# Patient Record
Sex: Female | Born: 1965 | Race: White | Hispanic: No | Marital: Married | Smoking: Former smoker
Health system: Southern US, Community
[De-identification: ages and names within clinical notes are randomized; demographics above are authoritative.]

## PROBLEM LIST (undated history)

## (undated) DIAGNOSIS — M1990 Unspecified osteoarthritis, unspecified site: Secondary | ICD-10-CM

## (undated) DIAGNOSIS — Z8711 Personal history of peptic ulcer disease: Secondary | ICD-10-CM

## (undated) DIAGNOSIS — E559 Vitamin D deficiency, unspecified: Secondary | ICD-10-CM

## (undated) DIAGNOSIS — M25472 Effusion, left ankle: Secondary | ICD-10-CM

## (undated) DIAGNOSIS — G9332 Myalgic encephalomyelitis/chronic fatigue syndrome: Secondary | ICD-10-CM

## (undated) DIAGNOSIS — M797 Fibromyalgia: Secondary | ICD-10-CM

## (undated) DIAGNOSIS — M25471 Effusion, right ankle: Secondary | ICD-10-CM

## (undated) DIAGNOSIS — R5382 Chronic fatigue, unspecified: Secondary | ICD-10-CM

## (undated) DIAGNOSIS — K5900 Constipation, unspecified: Secondary | ICD-10-CM

## (undated) DIAGNOSIS — F419 Anxiety disorder, unspecified: Secondary | ICD-10-CM

## (undated) DIAGNOSIS — M549 Dorsalgia, unspecified: Secondary | ICD-10-CM

## (undated) DIAGNOSIS — D649 Anemia, unspecified: Secondary | ICD-10-CM

## (undated) DIAGNOSIS — E739 Lactose intolerance, unspecified: Secondary | ICD-10-CM

## (undated) DIAGNOSIS — R5383 Other fatigue: Secondary | ICD-10-CM

## (undated) DIAGNOSIS — K900 Celiac disease: Secondary | ICD-10-CM

## (undated) DIAGNOSIS — F32A Depression, unspecified: Secondary | ICD-10-CM

## (undated) DIAGNOSIS — M2550 Pain in unspecified joint: Secondary | ICD-10-CM

## (undated) DIAGNOSIS — M25475 Effusion, left foot: Secondary | ICD-10-CM

## (undated) DIAGNOSIS — Q249 Congenital malformation of heart, unspecified: Secondary | ICD-10-CM

## (undated) DIAGNOSIS — J42 Unspecified chronic bronchitis: Secondary | ICD-10-CM

## (undated) HISTORY — DX: Anxiety disorder, unspecified: F41.9

## (undated) HISTORY — DX: Congenital malformation of heart, unspecified: Q24.9

## (undated) HISTORY — DX: Constipation, unspecified: K59.00

## (undated) HISTORY — DX: Celiac disease: K90.0

## (undated) HISTORY — DX: Myalgic encephalomyelitis/chronic fatigue syndrome: G93.32

## (undated) HISTORY — PX: HERNIA REPAIR: SHX51

## (undated) HISTORY — DX: Effusion, left ankle: M25.471

## (undated) HISTORY — DX: Personal history of peptic ulcer disease: Z87.11

## (undated) HISTORY — DX: Dorsalgia, unspecified: M54.9

## (undated) HISTORY — DX: Other fatigue: R53.83

## (undated) HISTORY — DX: Anemia, unspecified: D64.9

## (undated) HISTORY — DX: Pain in unspecified joint: M25.50

## (undated) HISTORY — DX: Vitamin D deficiency, unspecified: E55.9

## (undated) HISTORY — DX: Unspecified osteoarthritis, unspecified site: M19.90

## (undated) HISTORY — DX: Chronic fatigue, unspecified: R53.82

## (undated) HISTORY — DX: Lactose intolerance, unspecified: E73.9

## (undated) HISTORY — DX: Effusion, left ankle: M25.472

## (undated) HISTORY — DX: Depression, unspecified: F32.A

## (undated) HISTORY — DX: Unspecified chronic bronchitis: J42

## (undated) HISTORY — DX: Fibromyalgia: M79.7

## (undated) HISTORY — DX: Effusion, left foot: M25.475

---
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
## 1994-09-12 HISTORY — PX: PARTIAL HYSTERECTOMY: SHX80

## 1997-09-12 HISTORY — PX: TOTAL VAGINAL HYSTERECTOMY: SHX2548

## 2004-03-10 ENCOUNTER — Ambulatory Visit (HOSPITAL_COMMUNITY): Admission: RE | Admit: 2004-03-10 | Discharge: 2004-03-10 | Payer: Self-pay | Admitting: General Surgery

## 2004-03-17 ENCOUNTER — Ambulatory Visit (HOSPITAL_COMMUNITY): Admission: RE | Admit: 2004-03-17 | Discharge: 2004-03-17 | Payer: Self-pay | Admitting: General Surgery

## 2010-10-03 ENCOUNTER — Encounter: Payer: Self-pay | Admitting: General Surgery

## 2013-03-18 ENCOUNTER — Ambulatory Visit: Payer: Self-pay | Admitting: Internal Medicine

## 2013-03-26 ENCOUNTER — Ambulatory Visit: Payer: Self-pay | Admitting: Internal Medicine

## 2013-04-18 ENCOUNTER — Encounter: Payer: Self-pay | Admitting: Internal Medicine

## 2013-04-18 ENCOUNTER — Ambulatory Visit (INDEPENDENT_AMBULATORY_CARE_PROVIDER_SITE_OTHER): Payer: BC Managed Care – PPO | Admitting: Internal Medicine

## 2013-04-18 VITALS — BP 130/81 | HR 90 | Resp 16 | Ht 68.0 in | Wt 223.0 lb

## 2013-04-18 DIAGNOSIS — K900 Celiac disease: Secondary | ICD-10-CM | POA: Insufficient documentation

## 2013-04-18 DIAGNOSIS — E663 Overweight: Secondary | ICD-10-CM

## 2013-04-18 DIAGNOSIS — Z90710 Acquired absence of both cervix and uterus: Secondary | ICD-10-CM

## 2013-04-18 DIAGNOSIS — M1990 Unspecified osteoarthritis, unspecified site: Secondary | ICD-10-CM

## 2013-04-18 LAB — CBC WITH DIFFERENTIAL/PLATELET
Basophils Relative: 0 % (ref 0–1)
Hemoglobin: 14.4 g/dL (ref 12.0–15.0)
Lymphocytes Relative: 30 % (ref 12–46)
MCH: 30.5 pg (ref 26.0–34.0)
Monocytes Absolute: 0.5 10*3/uL (ref 0.1–1.0)
Monocytes Relative: 8 % (ref 3–12)
Neutrophils Relative %: 59 % (ref 43–77)
Platelets: 236 10*3/uL (ref 150–400)
RBC: 4.72 MIL/uL (ref 3.87–5.11)
RDW: 13.9 % (ref 11.5–15.5)
WBC: 5.8 10*3/uL (ref 4.0–10.5)

## 2013-04-18 NOTE — Progress Notes (Signed)
Subjective:    Patient ID: Sheila Lam, female    DOB: [birth year], 47 y.o.   MRN: [id number]  HPI  Sheila Lam is here as a new pt for first visit to establish care.   She has a PMH of Celiac sprue, anxiety,  DJD with chronic pain involving neck, shoulder and back  (managed by Dr. Ermalene Postin).  She is S/P hysterectomy and unilateral oopherectomy.   She is concened that she cannnot lose weight.  She is under excess stress as her husband lost his job and she is working two jobs and will be starting a week-end job soon.  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     She is fatigued and has trouble losing weight.  No dry skin no alopecia.    Allergies  Allergen Reactions  . Codeine Nausea And Vomiting  . Gluten Meal     Celiac disease  . Soy Allergy     Breast swelling   Past Medical History  Diagnosis Date  . Anxiety   . Arthritis   . Celiac disease   . 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 Partial hysterectomy  1996  . Total vaginal hysterectomy  1999  . Cesarean section  1998  . Other surgical history  1986    Hernia surgery   History   Social History  . Marital Status: Single    Spouse Name: N/A    Number of Children: N/A  . Years of Education: N/A   Occupational History  . Not on file.   Social History Main Topics  . Smoking status: Former Smoker -- 9 years    Quit date: 09/12/1996  . Smokeless tobacco: Not on file  . Alcohol Use: 1.5 oz/week    3 drink(s) per week  . Drug Use: No  . Sexually Active: Yes -- Female partner(s)   Other Topics Concern  . Not on file   Social History Narrative  . No narrative on file   Family History  Problem Relation Age of Onset  . Hypertension Father   . Hyperlipidemia Father   . Hypothyroidism Mother   . Leukemia Paternal Grandmother   . Diabetes Paternal Grandmother   . Heart disease Paternal Grandmother   . Hypothyroidism  Maternal Grandmother   . Hyperthyroidism Maternal Grandmother   . Hyperthyroidism Maternal Grandmother   . Heart disease Maternal Grandmother    Patient Active Problem List   Diagnosis Date Noted  . Celiac sprue 04/18/2013  . DJD (degenerative joint disease) 04/18/2013  . S/P hysterectomy 04/18/2013   No current outpatient prescriptions on file prior to visit.   No current facility-administered medications on file prior to visit.      Review of Systems    see HPI Objective:   Physical Exam Physical Exam  Nursing note and vitals reviewed.  Constitutional: She is oriented to person, place, and time. She appears well-developed and well-nourished.  HENT:  Head: Normocephalic and atraumatic.  Cardiovascular: Normal rate and regular rhythm. Exam reveals no gallop and no friction rub.  No murmur heard.  Pulmonary/Chest: Breath sounds normal. She has no wheezes. She has no rales.  Neurological: She is alert and oriented to person, place, and time.  Skin: Skin is warm and dry.  Psychiatric: She has a normal mood and affect. Her behavior is normal.  Assessment & Plan:  Celiac sprue  Will check thyroid levels with free T3 and T4.    DJD with chronic pain  Managed by neurology Dr. Ermalene Postin  Obesity    S/P hysterectomy and unilateral oopherectomy

## 2013-04-19 LAB — COMPREHENSIVE METABOLIC PANEL
AST: 18 U/L (ref 0–37)
Alkaline Phosphatase: 53 U/L (ref 39–117)
BUN: 16 mg/dL (ref 6–23)
CO2: 29 mEq/L (ref 19–32)
Chloride: 102 mEq/L (ref 96–112)
Creat: 0.77 mg/dL (ref 0.50–1.10)
Sodium: 138 mEq/L (ref 135–145)
Total Bilirubin: 0.5 mg/dL (ref 0.3–1.2)

## 2013-04-19 LAB — T4, FREE: Free T4: 1.3 ng/dL (ref 0.80–1.80)

## 2013-04-19 LAB — LIPID PANEL
HDL: 55 mg/dL (ref >39)
Total CHOL/HDL Ratio: 3.3 Ratio
Triglycerides: 99 mg/dL (ref <150)
VLDL: 20 mg/dL (ref 0–40)

## 2013-04-19 LAB — T3, FREE: T3, Free: 3.2 pg/mL (ref 2.3–4.2)

## 2013-04-19 LAB — TSH: TSH: 2.027 u[IU]/mL (ref 0.350–4.500)

## 2013-04-20 DIAGNOSIS — E663 Overweight: Secondary | ICD-10-CM | POA: Insufficient documentation

## 2013-04-22 ENCOUNTER — Telehealth: Payer: Self-pay | Admitting: *Deleted

## 2013-04-22 ENCOUNTER — Encounter: Payer: Self-pay | Admitting: *Deleted

## 2013-04-22 LAB — IODINE, SERUM/PLASMA: Iodine: 62 mcg/L (ref 52–109)

## 2013-04-22 NOTE — Telephone Encounter (Signed)
Message copied by Conley Rolls on Mon Apr 22, 2013  3:51 PM ------      Message from: Emi Belfast D      Created: Mon Apr 22, 2013  7:40 AM       Jolayne Haines            Call pt and let her know that her thyroid free levels are normal.  Her FSH indicates that her one ovary is still producing enough female hormone.  Her iodine level is pending.  She can schedule   Follow up if she wishes.              Ok to mail labs to her ------

## 2013-04-22 NOTE — Telephone Encounter (Signed)
Pt notified regarding test results ,ailed to home address Pt wishes to have something for sleep

## 2013-04-23 ENCOUNTER — Telehealth: Payer: Self-pay | Admitting: *Deleted

## 2013-04-23 MED ORDER — LORAZEPAM 0.5 MG PO TABS: ORAL_TABLET | ORAL | Status: DC

## 2013-04-23 NOTE — Telephone Encounter (Signed)
Left detailed message on pt's home VM per pts request regarding the ativan that was called in to [facility]

## 2013-04-23 NOTE — Telephone Encounter (Signed)
Advise pt I will give her a low dose anxiety med that will help her sleep.  She is only to use this twice a week and she needs to see me in office for follow up in 1-2 months.  She can start with 1/2 pill at hs  If this does not help she can go to 1 whole pill 2 night later.

## 2013-04-24 ENCOUNTER — Telehealth: Payer: Self-pay | Admitting: *Deleted

## 2013-04-24 NOTE — Telephone Encounter (Signed)
Too many side effects from Ambien   Especially with amnesia.  . I have never seen any celiac that could not tolerate Ativan.  She can discuss with her research MD if Ativan OK

## 2013-04-24 NOTE — Telephone Encounter (Signed)
See Heather's note

## 2013-04-24 NOTE — Telephone Encounter (Signed)
Luke called and said that she can not take Lorazepam because it is not gluten free.  She asked if we could call in Ambien instead as it is gluten free.

## 2013-04-25 ENCOUNTER — Encounter: Payer: Self-pay | Admitting: *Deleted

## 2013-04-25 ENCOUNTER — Telehealth: Payer: Self-pay | Admitting: *Deleted

## 2013-04-25 MED ORDER — ZOLPIDEM TARTRATE 5 MG PO TABS
5.0000 mg | ORAL_TABLET | Freq: Every evening | ORAL | Status: DC | PRN
Start: 2013-04-25 — End: 2015-06-11

## 2013-04-25 NOTE — Telephone Encounter (Signed)
LVM message for pt to return call

## 2013-04-25 NOTE — Telephone Encounter (Signed)
Pt called tearful stating that she is not able to tolerate ativan she has used it before and it makes her groggy the next day. Pt tearful and anxious on phone saying she hasnt slept in days and all her friends at the celiac support club  that have taken  [place] have been pleased. Pt states please can she prescribe ambien or try another sleep aid

## 2013-04-25 NOTE — Telephone Encounter (Signed)
Sheila Lam  Tell her that I will give her #6 Ambien that she is to use only twice a week.    She must see me in office for follow up before she will get any refills.  Give her a 30 min appt for follow up

## 2013-04-26 ENCOUNTER — Telehealth: Payer: Self-pay | Admitting: *Deleted

## 2013-04-26 NOTE — Telephone Encounter (Signed)
Ambien called in to [facility]

## 2013-05-14 ENCOUNTER — Other Ambulatory Visit: Payer: Self-pay | Admitting: *Deleted

## 2013-05-14 NOTE — Telephone Encounter (Signed)
See prior note  I want to see in office before I refill any more sleeping med

## 2013-05-14 NOTE — Telephone Encounter (Signed)
Left VM message requesting pt return call for a follow up appt regarding ambien

## 2013-05-14 NOTE — Telephone Encounter (Addendum)
Refill request pharmacy sent a request for 30 tablets copay is same for 6 or 30 see note from pharmacy on desk

## 2013-05-14 NOTE — Telephone Encounter (Signed)
Sheila Lam  I want to see [name] in office before I refill any more [name]

## 2013-05-16 ENCOUNTER — Telehealth: Payer: Self-pay | Admitting: *Deleted

## 2013-05-17 NOTE — Telephone Encounter (Signed)
error 

## 2015-06-11 ENCOUNTER — Encounter: Payer: Self-pay | Admitting: *Deleted

## 2015-06-11 ENCOUNTER — Emergency Department
Admission: EM | Admit: 2015-06-11 | Discharge: 2015-06-11 | Disposition: A | Discharge status: Home / Self Care | Payer: BLUE CROSS/BLUE SHIELD | Source: Home / Self Care | Attending: Family Medicine | Admitting: Family Medicine

## 2015-06-11 DIAGNOSIS — R059 Cough, unspecified: Secondary | ICD-10-CM

## 2015-06-11 DIAGNOSIS — J0101 Acute recurrent maxillary sinusitis: Secondary | ICD-10-CM

## 2015-06-11 DIAGNOSIS — R05 Cough: Secondary | ICD-10-CM

## 2015-06-11 MED ORDER — BENZONATATE 100 MG PO CAPS: 100.0000 mg | ORAL_CAPSULE | Freq: Three times a day (TID) | ORAL | Status: DC

## 2015-06-11 MED ORDER — AMOXICILLIN-POT CLAVULANATE 875-125 MG PO TABS: 1.0000 | ORAL_TABLET | Freq: Two times a day (BID) | ORAL | Status: DC

## 2015-06-11 NOTE — ED Notes (Signed)
Pt c/o 4+weeks sinus congestion and drainage. More recently became worse with thicker drainage and cough. Taken Mucinex, Flonase and Advil.

## 2015-06-11 NOTE — ED Provider Notes (Signed)
CSN: [id number]     Arrival date & time 06/11/15  1220 History   First MD Initiated Contact with Patient 06/11/15 1223     Chief Complaint  Patient presents with  . Sinus Problem  . Cough   (Consider location/radiation/quality/duration/timing/severity/associated sxs/prior Treatment) HPI  Pt is a 49yo feamle presenting to [facility] with c/o over 4 weeks of nasal congestion and post-nasal drip but states over the last 1 week, her nasal drainage has become thicker.  Symptoms are moderate in severity.  Reports mild intermittent productive cough.  She has also develop frontal headaches and feels like there is pressure and swelling around the bridge of her nose.  Pt c/o bilateral ear "fullness." She has been taking Mucinex with minimal relief and states she uses Flonase daily.  Pt also notes she is looking for a new PCP as her prior one just left the office she was going to.  Denies sick contacts or recent travel.   Past Medical History  Diagnosis Date  . Anxiety   . Arthritis   . Celiac disease   . 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 Partial hysterectomy  1996  . Total vaginal hysterectomy  1999  . Cesarean section  1998  . Other surgical history  1986    Hernia surgery   Family History  Problem Relation Age of Onset  . Hypertension Father   . Hyperlipidemia Father   . Hypothyroidism Mother   . Leukemia Paternal Grandmother   . Diabetes Paternal Grandmother   . Heart disease Paternal Grandmother   . Hypothyroidism Maternal Grandmother   . Hyperthyroidism Maternal Grandmother   . Hyperthyroidism Maternal Grandmother   . Heart disease Maternal Grandmother    Social History  Substance Use Topics  . Smoking status: Former Smoker -- 9 years    Quit date: 09/12/1996  . Smokeless tobacco: None  . Alcohol Use: 1.5 oz/week    3 drink(s) per week   OB History    No data available     Review of  Systems  Constitutional: Negative for fever and chills.  HENT: Positive for congestion, ear pain ( fullness), facial swelling ( around bridge of nose), postnasal drip, rhinorrhea and sinus pressure. Negative for sore throat, trouble swallowing and voice change.   Respiratory: Positive for cough. Negative for shortness of breath.   Cardiovascular: Negative for chest pain and palpitations.  Gastrointestinal: Negative for nausea, vomiting, abdominal pain and diarrhea.  Musculoskeletal: Negative for myalgias, back pain and arthralgias.  Skin: Negative for rash.  All other systems reviewed and are negative.   Allergies  Codeine; Gluten meal; and Soy allergy  Home Medications   Prior to Admission medications   Medication Sig Start Date End Date Taking? Authorizing Provider  amoxicillin-clavulanate (AUGMENTIN) 875-125 MG tablet Take 1 tablet by mouth 2 (two) times daily. One po bid x 7 days 06/11/15   Noland Fordyce, PA-C  benzonatate (TESSALON) 100 MG capsule Take 1 capsule (100 mg total) by mouth every 8 (eight) hours. 06/11/15   Noland Fordyce, PA-C   Meds Ordered and Administered this Visit  Medications - No data to display  BP 145/86 mmHg  Pulse 81  Temp(Src) 97.9 F (36.6 C) (Oral)  Resp 16  Ht 5' 8"  (1.727 m)  Wt 246 lb (111.585 kg)  BMI 37.41 kg/m2  SpO2 99% No data found.   Physical Exam  Constitutional: She appears well-developed and well-nourished. No distress.  HENT:  Head: Normocephalic and atraumatic.  Right Ear: Hearing, tympanic membrane, external ear and ear canal normal.  Left Ear: Hearing, tympanic membrane, external ear and ear canal normal.  Nose: Mucosal edema present. Right sinus exhibits maxillary sinus tenderness. Right sinus exhibits no frontal sinus tenderness. Left sinus exhibits maxillary sinus tenderness. Left sinus exhibits no frontal sinus tenderness.  Mouth/Throat: Uvula is midline, oropharynx is clear and moist and mucous membranes are normal.   Tenderness around bridge of nose and over maxillary sinuses. No obvious edema. No erythema or warmth.  Eyes: Conjunctivae are normal. No scleral icterus.  Neck: Normal range of motion. Neck supple.  Cardiovascular: Normal rate, regular rhythm and normal heart sounds.   Pulmonary/Chest: Effort normal and breath sounds normal. No respiratory distress. She has no wheezes. She has no rales. She exhibits no tenderness.  Musculoskeletal: Normal range of motion.  Neurological: She is alert.  Skin: Skin is warm and dry. She is not diaphoretic.  Nursing note and vitals reviewed.   ED Course  Procedures (including critical care time)  Labs Review Labs Reviewed - No data to display  Imaging Review No results found.   MDM   1. Acute recurrent maxillary sinusitis   2. Cough    Pt c/o worsening nasal congestion with facial pain and pressure that initially started over 1 month ago but worse over last 1 week. Pt reports concern for swelling around bridge of nose. No obvious edema, erythema or warmth. Not concerned for cellulitis, however, pt is tender over maxillary sinuses. Will tx for bacterial cause of maxillary sinusitis. Rx: Augmentin and Tessalon. Pt may continue using Mucinex and Flonase. Resources for Primary Care provided. F/u in 1 week if not improving, sooner if worsening. Patient verbalized understanding and agreement with treatment plan.   Noland Fordyce, PA-C 06/11/15 1259

## 2015-06-11 NOTE — Discharge Instructions (Signed)
Please take antibiotics as prescribed and be sure to complete entire course even if you start to feel better to ensure infection does not come back.   Cool Mist Vaporizers Vaporizers may help relieve the symptoms of a cough and cold. They add moisture to the air, which helps mucus to become thinner and less sticky. This makes it easier to breathe and cough up secretions. Cool mist vaporizers do not cause serious burns like hot mist vaporizers, which may also be called steamers or humidifiers. Vaporizers have not been proven to help with colds. You should not use a vaporizer if you are allergic to mold. HOME CARE INSTRUCTIONS  Follow the package instructions for the vaporizer.  Do not use anything other than distilled water in the vaporizer.  Do not run the vaporizer all of the time. This can cause mold or bacteria to grow in the vaporizer.  Clean the vaporizer after each time it is used.  Clean and dry the vaporizer well before storing it.  Stop using the vaporizer if worsening respiratory symptoms develop. Document Released: 05/26/2004 Document Revised: 09/03/2013 Document Reviewed: 01/16/2013 [facility] Patient Information 2015 [place], [place]. This information is not intended to replace advice given to you by your health care provider. Make sure you discuss any questions you have with your health care provider.

## 2017-08-09 ENCOUNTER — Ambulatory Visit: Payer: BLUE CROSS/BLUE SHIELD | Admitting: Gastroenterology

## 2019-03-26 ENCOUNTER — Encounter: Payer: Self-pay | Admitting: Family Medicine

## 2019-03-26 ENCOUNTER — Ambulatory Visit: Payer: BC Managed Care – PPO | Admitting: Family Medicine

## 2019-03-26 ENCOUNTER — Other Ambulatory Visit: Payer: Self-pay

## 2019-03-26 VITALS — BP 132/78 | HR 67 | Temp 98.2°F | Ht 69.5 in | Wt 213.0 lb

## 2019-03-26 DIAGNOSIS — N10 Acute tubulo-interstitial nephritis: Secondary | ICD-10-CM

## 2019-03-26 DIAGNOSIS — R300 Dysuria: Secondary | ICD-10-CM

## 2019-03-26 LAB — POCT URINALYSIS DIPSTICK
Appearance: NEGATIVE
Bilirubin, UA: NEGATIVE
Blood, UA: NEGATIVE
Glucose, UA: NEGATIVE
Ketones, UA: NEGATIVE
Leukocytes, UA: NEGATIVE
Nitrite, UA: NEGATIVE
Odor: NEGATIVE
Protein, UA: NEGATIVE
Spec Grav, UA: 1.015 (ref 1.010–1.025)
Urobilinogen, UA: 0.2 E.U./dL
pH, UA: 6.5 (ref 5.0–8.0)

## 2019-03-26 MED ORDER — SULFAMETHOXAZOLE-TRIMETHOPRIM 800-160 MG PO TABS: 1.0000 | ORAL_TABLET | Freq: Two times a day (BID) | ORAL | 0 refills | Status: DC

## 2019-03-26 MED ORDER — NEURONTIN 100 MG PO CAPS: 100.0000 mg | ORAL_CAPSULE | Freq: Three times a day (TID) | ORAL | 3 refills | Status: DC

## 2019-03-26 MED ORDER — GABAPENTIN 100 MG PO CAPS
100.0000 mg | ORAL_CAPSULE | Freq: Three times a day (TID) | ORAL | 3 refills | Status: DC
Start: 2019-03-26 — End: 2019-03-26

## 2019-03-26 MED ORDER — CEFTRIAXONE SODIUM 1 G IJ SOLR
1.0000 g | Freq: Once | INTRAMUSCULAR | Status: AC
  Administered 2019-03-26: 1 g via INTRAMUSCULAR

## 2019-03-26 MED ORDER — BACTRIM DS 800-160 MG PO TABS: 1.0000 | ORAL_TABLET | Freq: Two times a day (BID) | ORAL | 0 refills | Status: DC

## 2019-03-26 NOTE — Progress Notes (Signed)
Chief Complaint  Patient presents with  . New Patient (Initial Visit)  . Dysuria  . Back Pain  . Urinary Urgency    Sheila Lam is a 53 y.o. female here for possible UTI.  Duration: 8 days. Symptoms: urinary frequency/urgency, dysuria, hesitancy, retention, flank pain b/l, nightsweats Denies: hematuria, fever, nausea and vomiting, vaginal discharge Hx of recurrent UTI? No   ROS:  Constitutional: denies fever GU: As noted in HPI  Past Medical History:  Diagnosis Date  . Anxiety   . Arthritis   . Celiac disease   . Heart abnormality    At birth but corrected itself about 1 year later    BP 132/78 (BP Location: Left Arm, Patient Position: Sitting, Cuff Size: Large)   Pulse 67   Temp 98.2 F (36.8 C) (Oral)   Ht 5' 9.5" (1.765 m)   Wt 213 lb (96.6 kg)   SpO2 98%   BMI 31.00 kg/m  General: Awake, alert, appears stated age Heart: RRR Lungs: CTAB, normal respiratory effort, no accessory muscle usage Abd: BS+, soft, NT, ND, no masses or organomegaly MSK: +CVA tenderness, worse on R Psych: Age appropriate judgment and insight  Acute pyelonephritis - Plan: POCT Urinalysis Dipstick, Urine Culture, BACTRIM DS 800-160 MG tablet, cefTRIAXone (ROCEPHIN) injection 1 g, UA neg, from a clinical standpoint, I am very worried for kidney infection with sweats and CVA ttp. Rocephin today (should not affect CD due to no GI involvement) and Bactrim, which is on list that is gluten free. Warning signs and symptoms verbalized and written down in AVS. Await results of culture.   Dysuria - Plan: POCT Urinalysis Dipstick, Urine Culture, cefTRIAXone (ROCEPHIN) injection 1 g  Orders as above. Stay hydrated. F/u in 1 mo for CPE. The patient voiced understanding and agreement to the plan.  [place], DO 03/26/19 3:53 PM

## 2019-03-26 NOTE — Patient Instructions (Addendum)
Stay hydrated.   Warning signs/symptoms: Uncontrollable nausea/vomiting, fevers, worsening symptoms despite treatment, confusion.  Give us around 2 business days to get culture back to you.  Let us know if you need anything. 

## 2019-03-27 ENCOUNTER — Encounter: Payer: Self-pay | Admitting: Family Medicine

## 2019-03-27 ENCOUNTER — Other Ambulatory Visit: Payer: Self-pay | Admitting: Family Medicine

## 2019-03-27 MED ORDER — CIPRO 500 MG PO TABS: 500.0000 mg | ORAL_TABLET | Freq: Two times a day (BID) | ORAL | 0 refills | Status: DC

## 2019-03-27 NOTE — Progress Notes (Signed)
Cipro sent in.

## 2019-03-28 LAB — URINE CULTURE
MICRO NUMBER:: 665039
Result:: NO GROWTH
SPECIMEN QUALITY:: ADEQUATE

## 2019-03-30 ENCOUNTER — Encounter: Payer: Self-pay | Admitting: Family Medicine

## 2019-04-01 ENCOUNTER — Other Ambulatory Visit: Payer: Self-pay | Admitting: Family Medicine

## 2019-04-01 MED ORDER — CEFDINIR 300 MG PO CAPS: 300.0000 mg | ORAL_CAPSULE | Freq: Two times a day (BID) | ORAL | 0 refills | Status: AC

## 2019-04-24 ENCOUNTER — Encounter: Payer: BC Managed Care – PPO | Admitting: Family Medicine

## 2019-05-17 ENCOUNTER — Encounter: Payer: BC Managed Care – PPO | Admitting: Family Medicine

## 2019-06-13 ENCOUNTER — Other Ambulatory Visit: Payer: Self-pay

## 2019-06-14 ENCOUNTER — Encounter: Payer: Self-pay | Admitting: Family Medicine

## 2019-06-14 ENCOUNTER — Ambulatory Visit (INDEPENDENT_AMBULATORY_CARE_PROVIDER_SITE_OTHER): Payer: BC Managed Care – PPO | Admitting: Family Medicine

## 2019-06-14 VITALS — BP 122/82 | HR 77 | Temp 97.5°F | Ht 70.0 in | Wt 213.2 lb

## 2019-06-14 DIAGNOSIS — Z0000 Encounter for general adult medical examination without abnormal findings: Secondary | ICD-10-CM

## 2019-06-14 DIAGNOSIS — Z114 Encounter for screening for human immunodeficiency virus [HIV]: Secondary | ICD-10-CM | POA: Diagnosis not present

## 2019-06-14 DIAGNOSIS — Z23 Encounter for immunization: Secondary | ICD-10-CM

## 2019-06-14 DIAGNOSIS — Z1231 Encounter for screening mammogram for malignant neoplasm of breast: Secondary | ICD-10-CM | POA: Diagnosis not present

## 2019-06-14 NOTE — Patient Instructions (Addendum)
Give [place] 2-3 business days to get the results of your labs back.   Keep the diet clean and stay active.  Consider core workouts and yoga on YouTube.   Let us know if you need anything.

## 2019-06-14 NOTE — Addendum Note (Signed)
Addended by: Sharon Seller B on: 06/14/2019 04:27 PM   Modules accepted: Orders

## 2019-06-14 NOTE — Addendum Note (Signed)
Addended by: Caffie Pinto on: 06/14/2019 04:23 PM   Modules accepted: Orders

## 2019-06-14 NOTE — Addendum Note (Signed)
Addended by: Caffie Pinto on: 06/14/2019 04:24 PM   Modules accepted: Orders

## 2019-06-14 NOTE — Progress Notes (Signed)
Chief Complaint  Patient presents with  . Annual Exam     Well Woman Sheila Lam is here for a complete physical.   Her last physical was >1 year ago.  Current diet: in general, a "healthy" diet. Current exercise: walking. Weight is stable and she denies daytime fatigue. No LMP recorded. Patient has had a hysterectomy. Seatbelt? Yes  Health Maintenance Mammogram- No Tetanus- No HIV screening- No  Past Medical History:  Diagnosis Date  . Anxiety   . Arthritis   . Celiac disease   . 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 CESAREAN SECTION  1998  . OTHER SURGICAL HISTORY  1986   Hernia surgery  . PARTIAL HYSTERECTOMY  1996  . TOTAL VAGINAL HYSTERECTOMY  1999    Medications  Current Outpatient Medications on File Prior to Visit  Medication Sig Dispense Refill  . NEURONTIN 100 MG capsule Take 1 capsule (100 mg total) by mouth 3 (three) times daily. 90 capsule 3   Allergies Allergies  Allergen Reactions  . Codeine Nausea And Vomiting  . Gluten Meal     Celiac disease  . Soy Allergy     Breast swelling    Review of Systems: Constitutional:  no unexpected weight changes Eye:  no recent significant change in vision Ear/Nose/Mouth/Throat:  Ears:  no tinnitus or vertigo and hearing is steadily worsening Nose/Mouth/Throat:  no complaints of nasal congestion, no sore throat Cardiovascular: no chest pain Respiratory:  no cough and no shortness of breath Gastrointestinal:  no abdominal pain, no change in bowel habits GU:  Female: negative for dysuria or pelvic pain Musculoskeletal/Extremities: +flank pain b/l; otherwise no pain of the joints Integumentary (Skin/Breast):  no abnormal skin lesions reported Neurologic:  no headaches Endocrine: +thirst Hematologic/Lymphatic:  No areas of easy bleeding  Exam BP 122/82 (BP Location: Left Arm, Patient Position: Sitting, Cuff Size:  Normal)   Pulse 77   Temp (!) 97.5 F (36.4 C) (Temporal)   Ht 5' 10"  (1.778 m)   Wt 213 lb 4 oz (96.7 kg)   SpO2 98%   BMI 30.60 kg/m  General:  well developed, well nourished, in no apparent distress Skin:  no significant moles, warts, or growths Head:  no masses, lesions, or tenderness Eyes:  pupils equal and round, sclera anicteric without injection Ears:  canals without lesions, TMs shiny without retraction, no obvious effusion, no erythema Nose:  nares patent, septum midline, mucosa normal, and no drainage or sinus tenderness Throat/Pharynx:  lips and gingiva without lesion; tongue and uvula midline; non-inflamed pharynx; no exudates or postnasal drainage Neck: neck supple without adenopathy, thyromegaly, or masses Lungs:  clear to auscultation, breath sounds equal bilaterally, no respiratory distress Cardio:  regular rate and rhythm, no bruits, no LE edema Abdomen:  abdomen soft, nontender; bowel sounds normal; no masses or organomegaly Genital: Defer to GYN Musculoskeletal:  symmetrical muscle groups noted without atrophy or deformity Extremities:  no clubbing, cyanosis, or edema, no deformities, no skin discoloration Neuro:  gait normal; deep tendon reflexes normal and symmetric Psych: well oriented with normal range of affect and appropriate judgment/insight  Assessment and Plan  Well adult exam - Plan: CBC, IBC + Ferritin, Lipid panel, Comprehensive metabolic panel, LH, FSH, Vitamin D (25 hydroxy)  Screening for HIV (human immunodeficiency virus) - Plan: HIV Antibody (routine testing w rflx)  Encounter for screening mammogram for malignant  neoplasm of breast - Plan: MM DIGITAL SCREENING BILATERAL   Well 53 y.o. female. Counseled on diet and exercise. Declines flu shot Other orders as above. Follow up in 6 mo for med ck. The patient voiced understanding and agreement to the plan.  [place], DO 06/14/19 4:17 PM

## 2019-06-15 LAB — CBC
HCT: 43.5 % (ref 35.0–45.0)
Hemoglobin: 14.9 g/dL (ref 11.7–15.5)
MCH: 31.9 pg (ref 27.0–33.0)
MCHC: 34.3 g/dL (ref 32.0–36.0)
MCV: 93.1 fL (ref 80.0–100.0)
MPV: 12.9 fL — ABNORMAL HIGH (ref 7.5–12.5)
Platelets: 177 10*3/uL (ref 140–400)
RBC: 4.67 10*6/uL (ref 3.80–5.10)
RDW: 12.2 % (ref 11.0–15.0)
WBC: 3.8 10*3/uL (ref 3.8–10.8)

## 2019-06-15 LAB — COMPREHENSIVE METABOLIC PANEL
AG Ratio: 2.4 (calc) (ref 1.0–2.5)
ALT: 21 U/L (ref 6–29)
AST: 21 U/L (ref 10–35)
Albumin: 4.7 g/dL (ref 3.6–5.1)
Alkaline phosphatase (APISO): 59 U/L (ref 37–153)
BUN: 14 mg/dL (ref 7–25)
CO2: 31 mmol/L (ref 20–32)
Calcium: 10.2 mg/dL (ref 8.6–10.4)
Chloride: 107 mmol/L (ref 98–110)
Creat: 0.91 mg/dL (ref 0.50–1.05)
Globulin: 2 g/dL (calc) (ref 1.9–3.7)
Glucose, Bld: 85 mg/dL (ref 65–99)
Potassium: 4.7 mmol/L (ref 3.5–5.3)
Sodium: 142 mmol/L (ref 135–146)
Total Bilirubin: 0.6 mg/dL (ref 0.2–1.2)
Total Protein: 6.7 g/dL (ref 6.1–8.1)

## 2019-06-15 LAB — IRON,TIBC AND FERRITIN PANEL
%SAT: 50 % — ABNORMAL HIGH (ref 16–45)
Ferritin: 285 ng/mL — ABNORMAL HIGH (ref 16–232)
Iron: 132 ug/dL (ref 45–160)
TIBC: 265 ug/dL (ref 250–450)

## 2019-06-15 LAB — LIPID PANEL
Cholesterol: 190 mg/dL
HDL: 49 mg/dL — ABNORMAL LOW
LDL Cholesterol (Calc): 113 mg/dL — ABNORMAL HIGH
Non-HDL Cholesterol (Calc): 141 mg/dL — ABNORMAL HIGH
Total CHOL/HDL Ratio: 3.9 (calc)
Triglycerides: 163 mg/dL — ABNORMAL HIGH

## 2019-06-15 LAB — FOLLICLE STIMULATING HORMONE: FSH: 75.8 m[IU]/mL

## 2019-06-15 LAB — VITAMIN D 25 HYDROXY (VIT D DEFICIENCY, FRACTURES): Vit D, 25-Hydroxy: 23 ng/mL — ABNORMAL LOW (ref 30–100)

## 2019-06-15 LAB — HIV ANTIBODY (ROUTINE TESTING W REFLEX): HIV 1&2 Ab, 4th Generation: NONREACTIVE

## 2019-06-15 LAB — LUTEINIZING HORMONE: LH: 33.6 m[IU]/mL

## 2019-06-17 ENCOUNTER — Encounter: Payer: Self-pay | Admitting: Family Medicine

## 2019-06-18 DIAGNOSIS — Z0279 Encounter for issue of other medical certificate: Secondary | ICD-10-CM

## 2019-06-30 ENCOUNTER — Encounter: Payer: Self-pay | Admitting: Family Medicine

## 2019-07-08 ENCOUNTER — Telehealth: Payer: BC Managed Care – PPO | Admitting: Family

## 2019-07-08 DIAGNOSIS — B9689 Other specified bacterial agents as the cause of diseases classified elsewhere: Secondary | ICD-10-CM

## 2019-07-08 DIAGNOSIS — J019 Acute sinusitis, unspecified: Secondary | ICD-10-CM | POA: Diagnosis not present

## 2019-07-08 MED ORDER — AMOXICILLIN-POT CLAVULANATE 875-125 MG PO TABS: 1.0000 | ORAL_TABLET | Freq: Two times a day (BID) | ORAL | 0 refills | Status: DC

## 2019-07-08 NOTE — Progress Notes (Signed)
We are sorry that you are not feeling well.  Here is how we plan to help!  Based on what you have shared with me it looks like you have sinusitis.  Sinusitis is inflammation and infection in the sinus cavities of the head.  Based on your presentation I believe you most likely have Acute Bacterial Sinusitis.  This is an infection caused by bacteria and is treated with antibiotics. I have prescribed Augmentin 833m/125mg one tablet twice daily with food, for 7 days. You may use an oral decongestant such as Mucinex D or if you have glaucoma or high blood pressure use plain Mucinex. Saline nasal spray help and can safely be used as often as needed for congestion.  If you develop worsening sinus pain, fever or notice severe headache and vision changes, or if symptoms are not better after completion of antibiotic, please schedule an appointment with a health care provider.    Sinus infections are not as easily transmitted as other respiratory infection, however we still recommend that you avoid close contact with loved ones, especially the very young and elderly.  Remember to wash your hands thoroughly throughout the day as this is the number one way to prevent the spread of infection!  Home Care:  Only take medications as instructed by your medical team.  Complete the entire course of an antibiotic.  Do not take these medications with alcohol.  A steam or ultrasonic humidifier can help congestion.  You can place a towel over your head and breathe in the steam from hot water coming from a faucet.  Avoid close contacts especially the very young and the elderly.  Cover your mouth when you cough or sneeze.  Always remember to wash your hands.  Get Help Right Away If:  You develop worsening fever or sinus pain.  You develop a severe head ache or visual changes.  Your symptoms persist after you have completed your treatment plan.  Make sure you  Understand these instructions.  Will watch your  condition.  Will get help right away if you are not doing well or get worse.  Your e-visit answers were reviewed by a board certified advanced clinical practitioner to complete your personal care plan.  Depending on the condition, your plan could have included both over the counter or prescription medications.  If there is a problem please reply  once you have received a response from your provider.  Your safety is important to [place]  If you have drug allergies check your prescription carefully.    You can use MyChart to ask questions about today's visit, request a non-urgent call back, or ask for a work or school excuse for 24 hours related to this e-Visit. If it has been greater than 24 hours you will need to follow up with your provider, or enter a new e-Visit to address those concerns.  You will get an e-mail in the next two days asking about your experience.  I hope that your e-visit has been valuable and will speed your recovery. Thank you for using e-visits.  Greater than 5 minutes, yet less than 10 minutes of time have been spent researching, coordinating, and implementing care for this patient today.  Thank you for the details you included in the comment boxes. Those details are very helpful in determining the best course of treatment for you and help [place] provide the best care.

## 2019-07-16 ENCOUNTER — Telehealth: Payer: BC Managed Care – PPO | Admitting: Nurse Practitioner

## 2019-07-16 DIAGNOSIS — H9203 Otalgia, bilateral: Secondary | ICD-10-CM | POA: Diagnosis not present

## 2019-07-16 MED ORDER — FLUTICASONE PROPIONATE 50 MCG/ACT NA SUSP: 2.0000 | Freq: Every day | NASAL | 6 refills | Status: DC

## 2019-07-16 NOTE — Progress Notes (Signed)
We are sorry that you are not feeling well.  Here is how we plan to help!  Based on what you have shared with me it looks like you have otalgia . Napoleon Form is ear oain that can come form fluid in middle ear. The augmentin you have been taking should prevent any infection in inner ear. I have rx flonase nasal spray which shuld open up th etube from your inner ear to your nasal passage and aklow that fluid to drain out. If this does not help you will need a face to face visit. Some authorities believe that zinc sprays or the use of Echinacea may shorten the course of your symptoms.  Sinus infections are not as easily transmitted as other respiratory infection, however we still recommend that you avoid close contact with loved ones, especially the very young and elderly.  Remember to wash your hands thoroughly throughout the day as this is the number one way to prevent the spread of infection!  Home Care:  Only take medications as instructed by your medical team.  Do not take these medications with alcohol.  A steam or ultrasonic humidifier can help congestion.  You can place a towel over your head and breathe in the steam from hot water coming from a faucet.  Avoid close contacts especially the very young and the elderly.  Cover your mouth when you cough or sneeze.  Always remember to wash your hands.  Get Help Right Away If:  You develop worsening fever or sinus pain.  You develop a severe head ache or visual changes.  Your symptoms persist after you have completed your treatment plan.  Make sure you  Understand these instructions.  Will watch your condition.  Will get help right away if you are not doing well or get worse.  Your e-visit answers were reviewed by a board certified advanced clinical practitioner to complete your personal care plan.  Depending on the condition, your plan could have included both over the counter or prescription medications.  If there is a problem  please reply  once you have received a response from your provider.  Your safety is important to [place].  If you have drug allergies check your prescription carefully.    You can use MyChart to ask questions about today's visit, request a non-urgent call back, or ask for a work or school excuse for 24 hours related to this e-Visit. If it has been greater than 24 hours you will need to follow up with your provider, or enter a new e-Visit to address those concerns.  You will get an e-mail in the next two days asking about your experience.  I hope that your e-visit has been valuable and will speed your recovery. Thank you for using e-visits.   5-10 minutes spent reviewing and documenting in chart.

## 2019-07-26 ENCOUNTER — Other Ambulatory Visit (HOSPITAL_BASED_OUTPATIENT_CLINIC_OR_DEPARTMENT_OTHER): Payer: Self-pay | Admitting: Family Medicine

## 2019-07-26 DIAGNOSIS — Z1231 Encounter for screening mammogram for malignant neoplasm of breast: Secondary | ICD-10-CM

## 2019-07-30 ENCOUNTER — Other Ambulatory Visit: Payer: Self-pay

## 2019-07-30 ENCOUNTER — Ambulatory Visit (HOSPITAL_BASED_OUTPATIENT_CLINIC_OR_DEPARTMENT_OTHER)
Admission: RE | Admit: 2019-07-30 | Discharge: 2019-07-30 | Disposition: A | Discharge status: Home / Self Care | Payer: BC Managed Care – PPO | Source: Ambulatory Visit | Attending: Family Medicine | Admitting: Family Medicine

## 2019-07-30 DIAGNOSIS — Z1231 Encounter for screening mammogram for malignant neoplasm of breast: Secondary | ICD-10-CM

## 2019-09-11 ENCOUNTER — Ambulatory Visit (INDEPENDENT_AMBULATORY_CARE_PROVIDER_SITE_OTHER): Payer: BC Managed Care – PPO | Admitting: Family Medicine

## 2019-09-11 ENCOUNTER — Encounter: Payer: Self-pay | Admitting: Family Medicine

## 2019-09-11 ENCOUNTER — Other Ambulatory Visit: Payer: Self-pay

## 2019-09-11 DIAGNOSIS — R195 Other fecal abnormalities: Secondary | ICD-10-CM

## 2019-09-11 DIAGNOSIS — H04123 Dry eye syndrome of bilateral lacrimal glands: Secondary | ICD-10-CM | POA: Diagnosis not present

## 2019-09-11 DIAGNOSIS — K5900 Constipation, unspecified: Secondary | ICD-10-CM | POA: Diagnosis not present

## 2019-09-11 MED ORDER — PREDNISONE 20 MG PO TABS: 40.0000 mg | ORAL_TABLET | Freq: Every day | ORAL | 0 refills | Status: AC

## 2019-09-11 NOTE — Progress Notes (Signed)
Chief Complaint  Patient presents with  . Constipation    celiac disease    Subjective: Patient is a 53 y.o. female here for constipation. Due to COVID-19 pandemic, we are interacting via web portal for an electronic face-to-face visit. I verified patient's ID using 2 identifiers. Patient agreed to proceed with visit via this method. Patient is at home, I am at office. Patient and I are present for visit.    For nearly 4 weeks, pt has not had a satisfying bm. Has gained 8 lbs and feels bloated. Used Senna w/o relief. Tried various suppositories with little yield. She is still passing gas. She has Celiac's and thinks she may have Sjogren's also. She has dry eyes/mouth, thinks her constipation could be related to that. She had a nml colonoscopy 1-2 years ago, does follow w GI. No bleeding, wt loss, N/V. Notes stools are pencil thin. This happened before when she was initially dx'd w CD, but never lasted this long.   ROS: Eyes: +dryness GI: +bloating  Past Medical History:  Diagnosis Date  . Anxiety   . Arthritis   . Celiac disease   . 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 Stay hydrated. 3- consider dx testing for Sjogren's in future. Trial pred burst to see if that helps. Consider enema. If no improvement by [facility], will call GI team. Warning s/s's discussed regarding SBO.  The patient voiced understanding and agreement to the plan.  [place], DO 09/11/19  2:08 PM

## 2019-09-18 ENCOUNTER — Encounter: Payer: Self-pay | Admitting: Family Medicine

## 2019-09-18 ENCOUNTER — Other Ambulatory Visit: Payer: Self-pay

## 2019-09-18 ENCOUNTER — Ambulatory Visit (INDEPENDENT_AMBULATORY_CARE_PROVIDER_SITE_OTHER): Payer: BC Managed Care – PPO | Admitting: Family Medicine

## 2019-09-18 DIAGNOSIS — K900 Celiac disease: Secondary | ICD-10-CM | POA: Diagnosis not present

## 2019-09-18 NOTE — Progress Notes (Signed)
Chief Complaint  Patient presents with  . FMLA    FMLA paperwork printed and on your desk    Subjective: Patient is a 54 y.o. female here for encounter to fill out form. Due to COVID-19 pandemic, we are interacting via web portal for an electronic face-to-face visit. I verified patient's ID using 2 identifiers. Patient agreed to proceed with visit via this method. Patient is at home, I am at home. Patient and I are present for visit.   Pt w hx of Celiac's disease, quite severe. Follows with Dr. Carmelina Peal of [place] who specializes in this disease. Anxiety/stress add to her symptoms. If she consumes gluten she could be non-functional for 7 d. Dx'd w this lifelong condition around 01/11/96, started seeing her specialist around 01/10/13. 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 She would also like to quarantine if exposed. No current URI s/s's.  ROS: GI: No diarrhea  Past Medical History:  Diagnosis Date  . Anxiety   . Arthritis   . Celiac disease   . 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 She will MyChart message the fax number. 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  Fu as originally scheduled.  The patient voiced understanding and agreement to the plan.  [place], DO 09/18/19  3:39 PM

## 2019-09-20 ENCOUNTER — Telehealth: Payer: Self-pay | Admitting: Family Medicine

## 2019-09-20 NOTE — Telephone Encounter (Signed)
PCP has completed FMLA and letter for work restrictions per COVID Have faxed both to Attention Mrs. Sheila Lam at [phone]. The patient is aware. Received fax confirmation

## 2019-09-25 ENCOUNTER — Encounter: Payer: Self-pay | Admitting: Family Medicine

## 2019-09-25 ENCOUNTER — Other Ambulatory Visit: Payer: Self-pay

## 2019-09-25 ENCOUNTER — Ambulatory Visit (INDEPENDENT_AMBULATORY_CARE_PROVIDER_SITE_OTHER): Payer: BC Managed Care – PPO | Admitting: Family Medicine

## 2019-09-25 DIAGNOSIS — F411 Generalized anxiety disorder: Secondary | ICD-10-CM

## 2019-09-25 MED ORDER — TRAZODONE HCL 50 MG PO TABS: 25.0000 mg | ORAL_TABLET | Freq: Every evening | ORAL | 3 refills | Status: DC | PRN

## 2019-09-25 MED ORDER — ALBUTEROL SULFATE HFA 108 (90 BASE) MCG/ACT IN AERS: 2.0000 | INHALATION_SPRAY | Freq: Four times a day (QID) | RESPIRATORY_TRACT | 2 refills | Status: DC | PRN

## 2019-09-25 MED ORDER — SERTRALINE HCL 50 MG PO TABS: 50.0000 mg | ORAL_TABLET | Freq: Every day | ORAL | 3 refills | Status: DC

## 2019-09-25 NOTE — Progress Notes (Signed)
CC: Anxiety  Subjective Sheila Lam is an 55 y.o. female who presents with anxiety. Due to COVID-19 pandemic, we are interacting via web portal for an electronic face-to-face visit. I verified patient's ID using 2 identifiers. Patient agreed to proceed with visit via this method. Patient is at home, I am at office. Patient and I are present for visit.  Symptoms began many years ago, getting worse during the pandemic.  Anxiety symptoms: chest pain, difficulty concentrating, feelings of losing control, insomnia, irritable, palpitations, psychomotor agitation, racing thoughts. Social stressors include worried about son, parents' health not in best condition, pandemic She is currently being treated with None Used to be seen by counseling team, stopped once her husband lost his job; he now has his job back  Past Medical History:  Diagnosis Date  . Anxiety   . Arthritis   . Celiac disease   . 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  Counseled on adjunctive treatment with exercise/physical activity. Contact counselor again.  F/u in 6 weeks. Patient voiced understanding and agreement to the plan.  [place], DO 09/25/19 2:49 PM

## 2019-09-27 ENCOUNTER — Encounter: Payer: Self-pay | Admitting: Family Medicine

## 2019-09-27 ENCOUNTER — Other Ambulatory Visit: Payer: Self-pay | Admitting: Family Medicine

## 2019-09-27 MED ORDER — ESCITALOPRAM OXALATE 10 MG PO TABS: 10.0000 mg | ORAL_TABLET | Freq: Every day | ORAL | 2 refills | Status: DC

## 2019-10-02 ENCOUNTER — Encounter: Payer: Self-pay | Admitting: Family Medicine

## 2019-11-29 ENCOUNTER — Encounter: Payer: Self-pay | Admitting: Family Medicine

## 2019-11-29 NOTE — Telephone Encounter (Signed)
Please advise if patient needs appt to address swollen lymph node and concerns over COVID vaccine

## 2019-12-05 ENCOUNTER — Encounter: Payer: Self-pay | Admitting: Family Medicine

## 2019-12-20 ENCOUNTER — Ambulatory Visit: Payer: BC Managed Care – PPO | Admitting: Family Medicine

## 2020-01-01 ENCOUNTER — Encounter: Payer: Self-pay | Admitting: Family Medicine

## 2020-01-01 ENCOUNTER — Other Ambulatory Visit: Payer: Self-pay

## 2020-01-01 ENCOUNTER — Telehealth (INDEPENDENT_AMBULATORY_CARE_PROVIDER_SITE_OTHER): Payer: BC Managed Care – PPO | Admitting: Family Medicine

## 2020-01-01 DIAGNOSIS — F5104 Psychophysiologic insomnia: Secondary | ICD-10-CM | POA: Diagnosis not present

## 2020-01-01 DIAGNOSIS — F418 Other specified anxiety disorders: Secondary | ICD-10-CM

## 2020-01-01 DIAGNOSIS — K900 Celiac disease: Secondary | ICD-10-CM

## 2020-01-01 MED ORDER — NEURONTIN 100 MG PO CAPS: 100.0000 mg | ORAL_CAPSULE | Freq: Three times a day (TID) | ORAL | 3 refills | Status: DC

## 2020-01-01 MED ORDER — PREDNISONE 20 MG PO TABS: 40.0000 mg | ORAL_TABLET | Freq: Every day | ORAL | 0 refills | Status: AC

## 2020-01-01 MED ORDER — TRAZODONE HCL 50 MG PO TABS: 25.0000 mg | ORAL_TABLET | Freq: Every evening | ORAL | 3 refills | Status: DC | PRN

## 2020-01-01 NOTE — Progress Notes (Signed)
Chief Complaint  Patient presents with  . Follow-up    Subjective: Patient is a 54 y.o. female here for f/u. Due to COVID-19 pandemic, we are interacting via web portal for an electronic face-to-face visit. I verified patient's ID using 2 identifiers. Patient agreed to proceed with visit via this method. Patient is at home, I am at office. Patient and I are present for visit.   Anxiety The past several months and in particular stressful for the patient.  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  She has been crying a lot and having high levels of anxiety.  She is also sleeping very poorly at night waking up every hour.  Her mother takes trazodone as needed for sleep and that is very helpful.  She is wondering if I would prescribe this for her.  She has seen a counselor/psychologist in the past but is not currently seeing one.  No homicidal or suicidal ideation.  Celiac disease The patient has a history of celiac disease and is currently having a flare.  She is having abdominal pain and diarrhea.  Prednisone has helped in the past but she tries to avoid this as it does cause her to gain weight.  Past Medical History:  Diagnosis Date  . Anxiety   . Arthritis   . Celiac disease   . 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  Trazodone as needed.  Counseled on exercise.  Hopefully she will be able to find some closure with this situation soon.  If she is not and still having lots of anxiety/stress, she will let us know and we will discuss a daily SSRI. Prednisone burst has helped in the past for her  celiac disease flares, I will send this in should she decide she needs it. F/u in 6 mo for CPE or prn.  The patient voiced understanding and agreement to the plan.  [place], DO 01/01/20  12:01 PM

## 2020-02-03 ENCOUNTER — Other Ambulatory Visit: Payer: Self-pay | Admitting: Family Medicine

## 2020-02-03 DIAGNOSIS — K900 Celiac disease: Secondary | ICD-10-CM

## 2020-04-07 NOTE — Progress Notes (Deleted)
Office Visit Note  Patient: Sheila Lam             Date of Birth: June 16, 1966           MRN: [id number]             PCP: Shelda Pal, DO Referring: Shelda Pal* Visit Date: 04/21/2020 Occupation: @GUAROCC @  Subjective:  No chief complaint on file.   History of Present Illness: Sheila Lam is a 54 y.o. female ***   Activities of Daily Living:  Patient reports morning stiffness for *** {minute/hour:19697}.   Patient {ACTIONS;DENIES/REPORTS:21021675::"Denies"} nocturnal pain.  Difficulty dressing/grooming: {ACTIONS;DENIES/REPORTS:21021675::"Denies"} Difficulty climbing stairs: {ACTIONS;DENIES/REPORTS:21021675::"Denies"} Difficulty getting out of chair: {ACTIONS;DENIES/REPORTS:21021675::"Denies"} Difficulty using hands for taps, buttons, cutlery, and/or writing: {ACTIONS;DENIES/REPORTS:21021675::"Denies"}  No Rheumatology ROS completed.   PMFS History:  Patient Active Problem List   Diagnosis Date Noted  . Overweight 04/20/2013  . Celiac disease 04/18/2013  . DJD (degenerative joint disease) 04/18/2013  . S/P hysterectomy 04/18/2013    Past Medical History:  Diagnosis Date  . Anxiety   . Arthritis   . Celiac disease   . 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 Hypertension Father   . Hyperlipidemia Father   . Hypothyroidism Mother   . Leukemia Paternal Grandmother   . Diabetes Paternal Grandmother   . Heart disease Paternal Grandmother   . Hypothyroidism Maternal Grandmother   . Hyperthyroidism Maternal Grandmother   . Hyperthyroidism Maternal Grandmother   . Heart disease Maternal Grandmother    Past Surgical History:  Procedure Laterality Date  . CESAREAN SECTION  1998  . OTHER SURGICAL HISTORY  1986   Hernia surgery  . PARTIAL HYSTERECTOMY  1996  . TOTAL VAGINAL HYSTERECTOMY  1999   Social History   Social History Narrative  . Not on file     Immunization History  Administered Date(s) Administered  . Tdap 06/14/2019     Objective: Vital Signs: There were no vitals taken for this visit.   Physical Exam   Musculoskeletal Exam: ***  CDAI Exam: CDAI Score: -- Patient Global: --; Provider Global: -- Swollen: --; Tender: -- Joint Exam 04/21/2020   No joint exam has been documented for this visit   There is currently no information documented on the homunculus. Go to the Rheumatology activity and complete the homunculus joint exam.  Investigation: No additional findings.  Imaging: No results found.  Recent Labs: Lab Results  Component Value Date   WBC 3.8 06/14/2019   HGB 14.9 06/14/2019   PLT 177 06/14/2019   NA 142 06/14/2019   K 4.7 06/14/2019   CL 107 06/14/2019   CO2 31 06/14/2019   GLUCOSE 85 06/14/2019   BUN 14 06/14/2019   CREATININE 0.91 06/14/2019   BILITOT 0.6 06/14/2019   ALKPHOS 53 04/18/2013   AST 21 06/14/2019   ALT 21 06/14/2019   PROT 6.7 06/14/2019   ALBUMIN 4.4 04/18/2013   CALCIUM 10.2 06/14/2019    Speciality Comments: No specialty comments available.  Procedures:  No procedures performed Allergies: Codeine, Gluten meal, and Soy allergy   Assessment / Plan:     Visit Diagnoses: No diagnosis found.  Orders: No orders of the defined types were placed in this encounter.  No orders of the defined types were placed in this encounter.   Face-to-face time spent with patient was *** minutes. Greater than 50% of  time was spent in counseling and coordination of care.  Follow-Up Instructions: No follow-ups on file.   Ofilia Neas, PA-C  Note - This record has been created using Dragon software.  Chart creation errors have been sought, but may not always  have been located. Such creation errors do not reflect on  the standard of medical care.

## 2020-04-21 ENCOUNTER — Other Ambulatory Visit: Payer: Self-pay

## 2020-04-21 ENCOUNTER — Ambulatory Visit: Payer: Self-pay | Admitting: Rheumatology

## 2020-04-27 ENCOUNTER — Telehealth: Payer: Self-pay | Admitting: Family Medicine

## 2020-04-27 NOTE — Telephone Encounter (Signed)
Caller : Sheila Lam Call Back # [phone]   Patient states that she is  returning a call in regards to her insurance . Patient also would like to know if Dr. Nani Ravens would like to her to get a third Covid Vaccine due to her immune issue.

## 2020-04-27 NOTE — Telephone Encounter (Signed)
See mychart -- same message and being taken care of through my chart.

## 2020-05-19 ENCOUNTER — Ambulatory Visit: Payer: BC Managed Care – PPO | Admitting: Rheumatology

## 2020-05-21 NOTE — Progress Notes (Addendum)
Office Visit Note  Patient: Sheila Lam             Date of Birth: 04/14/1966           MRN: [id number]             PCP: Shelda Pal, DO Referring: Shelda Pal* Visit Date: 06/04/2020 Occupation: @GUAROCC @  Subjective:  Dry mouth, dry eyes and muscle pain.   History of Present Illness: Sheila Lam is a 54 y.o. female seen in consultation per request of her PCP.  According to the patient her symptoms started about 1 year ago with increased fatigue.  She states she would feel that her whole body was swollen at times.  She states that she had few visits with her initial PCP who blamed it on being overweight.  She changed her PCP since then.  She changed her diet and she has been gluten-free for a long time due to celiac disease.  Now she has been on dairy free, soy free diet.  She avoids sugar intake.  She states for the last 6 to 8 months she has been experiencing extremely dry mouth, dry eyes and her eyes as she is red and irritated.  She has been also experiencing joint pain which she describes in her elbows, hands, lower back, hips, knees and her feet.  She has noticed occasional swelling in her knees and her hands.  She states she used to walk 3 to 4 miles 3-5 times a week but she has been feeling so tired that she stopped walking.  She has been yoga doing yoga and stretch bands which causes increased muscle pain.  She states due to her current medical situation she is feeling depressed and anxious.  She has a history of insomnia for almost a year.  She has tried trazodone but had side effects.  She has been taking magnesium which helps with insomnia to some extent.  She has been experiencing increased shortness of breath.  There is no family history of autoimmune disease.  She is gravida 1, para 1.  Activities of Daily Living:  Patient reports morning stiffness for 0 minutes.   Patient Reports nocturnal pain.  Difficulty dressing/grooming: Denies Difficulty  climbing stairs: Reports Difficulty getting out of chair: Denies Difficulty using hands for taps, buttons, cutlery, and/or writing: Denies  Review of Systems  Constitutional: Positive for fatigue and night sweats. Negative for weight gain and weight loss.  HENT: Positive for mouth sores, mouth dryness and nose dryness. Negative for trouble swallowing and trouble swallowing.   Eyes: Positive for pain and dryness. Negative for redness and visual disturbance.  Respiratory: Positive for cough, shortness of breath and difficulty breathing.        Gluten sensitivity  Cardiovascular: Positive for palpitations. Negative for chest pain, hypertension, irregular heartbeat and swelling in legs/feet.  Gastrointestinal: Positive for constipation. Negative for abdominal pain, blood in stool and diarrhea.  Endocrine: Positive for excessive thirst and increased urination.  Genitourinary: Positive for painful urination. Negative for difficulty urinating and vaginal dryness.  Musculoskeletal: Positive for arthralgias, joint pain, joint swelling, myalgias, morning stiffness, muscle tenderness and myalgias. Negative for muscle weakness.  Skin: Positive for color change. Negative for rash, hair loss, redness, skin tightness, ulcers and sensitivity to sunlight.  Allergic/Immunologic: Negative for susceptible to infections.  Neurological: Positive for dizziness, headaches, memory loss, night sweats and weakness.  Hematological: Positive for bruising/bleeding tendency. Negative for swollen glands.  Psychiatric/Behavioral: Positive for depressed mood,  confusion and sleep disturbance. The patient is not nervous/anxious.     PMFS History:  Patient Active Problem List   Diagnosis Date Noted  . Overweight 04/20/2013  . Celiac disease 04/18/2013  . DJD (degenerative joint disease) 04/18/2013  . S/P hysterectomy 04/18/2013    Past Medical History:  Diagnosis Date  . Anxiety   . Arthritis   . Celiac disease   .  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 Hypertension Father   . Hyperlipidemia Father   . Prostate cancer Father   . Hypothyroidism Mother   . Bladder Cancer Mother   . Leukemia Paternal Grandmother   . Diabetes Paternal Grandmother   . Heart disease Paternal Grandmother   . Hypothyroidism Maternal Grandmother   . Hyperthyroidism Maternal Grandmother   . Heart disease Maternal Grandmother   . Lupus Maternal Grandfather    Past Surgical History:  Procedure Laterality Date  . CESAREAN SECTION  1998  . HERNIA REPAIR  1980's   Per patient  . OTHER SURGICAL HISTORY  1986   Hernia surgery  . PARTIAL HYSTERECTOMY  1996  . TOTAL VAGINAL HYSTERECTOMY  1999   Social History   Social History Narrative  . Not on file   Immunization History  Administered Date(s) Administered  . PFIZER SARS-COV-2 Vaccination 11/08/2019, 12/06/2019  . Tdap 06/14/2019     Objective: Vital Signs: BP 138/84 (BP Location: Right Arm, Patient Position: Sitting, Cuff Size: Small)   Pulse 67   Resp 16   Ht 5' 5.5" (1.664 m)   Wt 221 lb 3.2 oz (100.3 kg)   BMI 36.25 kg/m    Physical Exam Vitals and nursing note reviewed.  Constitutional:      Appearance: She is well-developed.  HENT:     Head: Normocephalic and atraumatic.  Eyes:     Conjunctiva/sclera: Conjunctivae normal.  Cardiovascular:     Rate and Rhythm: Normal rate and regular rhythm.     Heart sounds: Normal heart sounds.  Pulmonary:     Effort: Pulmonary effort is normal.     Breath sounds: Normal breath sounds.  Abdominal:     General: Bowel sounds are normal.     Palpations: Abdomen is soft.  Musculoskeletal:     Cervical back: Normal range of motion.  Lymphadenopathy:     Cervical: No cervical adenopathy.  Skin:    General: Skin is warm and dry.     Capillary Refill: Capillary refill takes less than 2 seconds.  Neurological:     Mental  Status: She is alert and oriented to person, place, and time.  Psychiatric:        Behavior: Behavior normal.      Musculoskeletal Exam: C-spine, thoracic and lumbar spine were in good range of motion.  Shoulder joints, elbow joints, wrist joints, MCPs and PIPs and DIPs with good range of motion.  She tenderness across PIP joints and DIP joints.  She has tenderness over bilateral trochanteric bursa more so on the right side.  She has painful range of motion of her right hip joint.  She had painful range of motion of bilateral knee joints.  No warmth swelling effusion was noted.  She has bilateral pes cavus and hammertoes.  CDAI Exam: CDAI Score: -- Patient Global: --; Provider Global: -- Swollen: --; Tender: -- Joint Exam 06/04/2020   No joint exam has been documented for  this visit   There is currently no information documented on the homunculus. Go to the Rheumatology activity and complete the homunculus joint exam.  Investigation: No additional findings.  Imaging: No results found.  Recent Labs: Lab Results  Component Value Date   WBC 3.8 06/14/2019   HGB 14.9 06/14/2019   PLT 177 06/14/2019   NA 142 06/14/2019   K 4.7 06/14/2019   CL 107 06/14/2019   CO2 31 06/14/2019   GLUCOSE 85 06/14/2019   BUN 14 06/14/2019   CREATININE 0.91 06/14/2019   BILITOT 0.6 06/14/2019   ALKPHOS 53 04/18/2013   AST 21 06/14/2019   ALT 21 06/14/2019   PROT 6.7 06/14/2019   ALBUMIN 4.4 04/18/2013   CALCIUM 10.2 06/14/2019    Speciality Comments: No specialty comments available.  Procedures:  No procedures performed Allergies: Cephalexin, Codeine, Gluten meal, and Soy allergy   Assessment / Plan:     Visit Diagnoses: Dry mouth -she complains of severe dry mouth and dry eyes.  I will obtain following labs today.  Plan: ANA, Anti-scleroderma antibody, RNP Antibody, Anti-Smith antibody, Sjogrens syndrome-A extractable nuclear antibody, Sjogrens syndrome-B extractable nuclear antibody,  Anti-DNA antibody, double-stranded, C3 and C4  Dry eyes-she complains of dry eyes, redness in her eyes.  Myalgia -she has generalized hyperalgesia, generalized muscle pain and positive tender points.  I had brief discussion regarding possible fibromyalgia.  She complains of fatigue and insomnia as well.  She has been doing some stretching exercises and yoga.  Plan: CK  Other insomnia-she complains of insomnia for last few years.  She has tried medications but could not tolerate due to side effects.  She has been taking magnesium which helps her to sleep.  Pain in both hands -she complains of pain and discomfort in her bilateral hands and intermittent swelling.  No synovitis was noted today.  She had tenderness across PIPs and DIPs.  Mild DIP prominence was noted.  I will obtain following labs today.  Plan: XR Hand 2 View Right, XR Hand 2 View Left, x-ray of bilateral hands were unremarkable.  Sedimentation rate, Rheumatoid factor, Cyclic citrul peptide antibody, IgG, Angiotensin converting enzyme  Pain in right hip -she had discomfort in her right hip range of motion.  She also had some features of trochanteric bursitis.  Plan: XR HIP UNILAT W OR W/O PELVIS 2-3 VIEWS RIGHT.  X-ray of the hip joint was unremarkable.  Chronic pain of both knees -she complains of pain and discomfort in her bilateral knee joints and intermittent swelling.  Plan: XR KNEE 3 VIEW RIGHT, XR KNEE 3 VIEW LEFT.  X-rays showed bilateral chondromalacia patella.  Primary osteoarthritis of both feet-she had bilateral hammertoes.  Pes cavus-she has bilateral pes cavus.  Other fatigue -she complains of increased fatigue for the last 6 to 8 months.  Plan: CBC with Differential/Platelet, COMPLETE METABOLIC PANEL WITH GFR, TSH, Glucose 6 phosphate dehydrogenase, Serum protein electrophoresis with reflex  Shortness of breath -she has been experiencing increased shortness of breath and some cough she is uncertain if it is due to  sensitivity to gluten or some other reason.  Plan: DG Chest 2 View  Celiac disease-she has been on gluten-free diet.  She states her GI symptoms are very well controlled.  Anxiety and depression-she states that the anxiety and depression is getting worse due to overall pain.  Seasonal allergies  S/P hysterectomy  Orders: Orders Placed This Encounter  Procedures  . XR Hand 2 View Right  . XR Hand  2 View Left  . XR KNEE 3 VIEW RIGHT  . XR KNEE 3 VIEW LEFT  . XR HIP UNILAT W OR W/O PELVIS 2-3 VIEWS RIGHT  . DG Chest 2 View  . CBC with Differential/Platelet  . COMPLETE METABOLIC PANEL WITH GFR  . Sedimentation rate  . CK  . TSH  . Rheumatoid factor  . Cyclic citrul peptide antibody, IgG  . ANA  . Anti-scleroderma antibody  . RNP Antibody  . Anti-Smith antibody  . Sjogrens syndrome-A extractable nuclear antibody  . Sjogrens syndrome-B extractable nuclear antibody  . Anti-DNA antibody, double-stranded  . C3 and C4  . Glucose 6 phosphate dehydrogenase  . Serum protein electrophoresis with reflex  . Angiotensin converting enzyme   No orders of the defined types were placed in this encounter.     Follow-Up Instructions: Return for Sicca symptoms, fatigue, myalgia.   Bo Merino, MD  Note - This record has been created using Editor, commissioning.  Chart creation errors have been sought, but may not always  have been located. Such creation errors do not reflect on  the standard of medical care.

## 2020-06-04 ENCOUNTER — Ambulatory Visit: Payer: Self-pay

## 2020-06-04 ENCOUNTER — Ambulatory Visit (HOSPITAL_COMMUNITY)
Admission: RE | Admit: 2020-06-04 | Discharge: 2020-06-04 | Disposition: A | Discharge status: Home / Self Care | Payer: BC Managed Care – PPO | Source: Ambulatory Visit | Attending: Rheumatology | Admitting: Rheumatology

## 2020-06-04 ENCOUNTER — Encounter: Payer: Self-pay | Admitting: Rheumatology

## 2020-06-04 ENCOUNTER — Ambulatory Visit: Payer: BC Managed Care – PPO | Admitting: Rheumatology

## 2020-06-04 ENCOUNTER — Other Ambulatory Visit: Payer: Self-pay

## 2020-06-04 VITALS — BP 138/84 | HR 67 | Resp 16 | Ht 65.5 in | Wt 221.2 lb

## 2020-06-04 DIAGNOSIS — M79642 Pain in left hand: Secondary | ICD-10-CM | POA: Diagnosis not present

## 2020-06-04 DIAGNOSIS — R682 Dry mouth, unspecified: Secondary | ICD-10-CM | POA: Diagnosis not present

## 2020-06-04 DIAGNOSIS — Q667 Congenital pes cavus, unspecified foot: Secondary | ICD-10-CM

## 2020-06-04 DIAGNOSIS — R0602 Shortness of breath: Secondary | ICD-10-CM | POA: Diagnosis present

## 2020-06-04 DIAGNOSIS — M19072 Primary osteoarthritis, left ankle and foot: Secondary | ICD-10-CM

## 2020-06-04 DIAGNOSIS — G4709 Other insomnia: Secondary | ICD-10-CM

## 2020-06-04 DIAGNOSIS — M25562 Pain in left knee: Secondary | ICD-10-CM

## 2020-06-04 DIAGNOSIS — F419 Anxiety disorder, unspecified: Secondary | ICD-10-CM

## 2020-06-04 DIAGNOSIS — G8929 Other chronic pain: Secondary | ICD-10-CM | POA: Diagnosis not present

## 2020-06-04 DIAGNOSIS — M25551 Pain in right hip: Secondary | ICD-10-CM | POA: Diagnosis not present

## 2020-06-04 DIAGNOSIS — M791 Myalgia, unspecified site: Secondary | ICD-10-CM | POA: Diagnosis not present

## 2020-06-04 DIAGNOSIS — M79641 Pain in right hand: Secondary | ICD-10-CM | POA: Diagnosis not present

## 2020-06-04 DIAGNOSIS — J302 Other seasonal allergic rhinitis: Secondary | ICD-10-CM

## 2020-06-04 DIAGNOSIS — F329 Major depressive disorder, single episode, unspecified: Secondary | ICD-10-CM

## 2020-06-04 DIAGNOSIS — K900 Celiac disease: Secondary | ICD-10-CM

## 2020-06-04 DIAGNOSIS — M25561 Pain in right knee: Secondary | ICD-10-CM

## 2020-06-04 DIAGNOSIS — F32A Depression, unspecified: Secondary | ICD-10-CM

## 2020-06-04 DIAGNOSIS — Z111 Encounter for screening for respiratory tuberculosis: Secondary | ICD-10-CM

## 2020-06-04 DIAGNOSIS — Z90710 Acquired absence of both cervix and uterus: Secondary | ICD-10-CM

## 2020-06-04 DIAGNOSIS — H04123 Dry eye syndrome of bilateral lacrimal glands: Secondary | ICD-10-CM | POA: Diagnosis not present

## 2020-06-04 DIAGNOSIS — M19071 Primary osteoarthritis, right ankle and foot: Secondary | ICD-10-CM

## 2020-06-04 DIAGNOSIS — R5383 Other fatigue: Secondary | ICD-10-CM

## 2020-06-05 NOTE — Progress Notes (Signed)
I will discuss results at the follow-up visit.

## 2020-06-05 NOTE — Progress Notes (Signed)
Chest xray is normal

## 2020-06-08 ENCOUNTER — Ambulatory Visit: Payer: BC Managed Care – PPO | Admitting: Rheumatology

## 2020-06-10 LAB — COMPLETE METABOLIC PANEL WITH GFR
AG Ratio: 2.6 (calc) — ABNORMAL HIGH (ref 1.0–2.5)
ALT: 23 U/L (ref 6–29)
AST: 20 U/L (ref 10–35)
Albumin: 4.6 g/dL (ref 3.6–5.1)
Alkaline phosphatase (APISO): 54 U/L (ref 37–153)
BUN: 16 mg/dL (ref 7–25)
CO2: 27 mmol/L (ref 20–32)
Calcium: 9.6 mg/dL (ref 8.6–10.4)
Chloride: 104 mmol/L (ref 98–110)
Creat: 0.84 mg/dL (ref 0.50–1.05)
GFR, Est African American: 91 mL/min/{1.73_m2} (ref >=60)
GFR, Est Non African American: 79 mL/min/{1.73_m2} (ref >=60)
Globulin: 1.8 g/dL (calc) — ABNORMAL LOW (ref 1.9–3.7)
Glucose, Bld: 97 mg/dL (ref 65–99)
Potassium: 4.2 mmol/L (ref 3.5–5.3)
Sodium: 140 mmol/L (ref 135–146)
Total Bilirubin: 0.7 mg/dL (ref 0.2–1.2)
Total Protein: 6.4 g/dL (ref 6.1–8.1)

## 2020-06-10 LAB — SJOGRENS SYNDROME-A EXTRACTABLE NUCLEAR ANTIBODY: SSA (Ro) (ENA) Antibody, IgG: <1 AI

## 2020-06-10 LAB — ANTI-SCLERODERMA ANTIBODY: Scleroderma (Scl-70) (ENA) Antibody, IgG: <1 AI

## 2020-06-10 LAB — PROTEIN ELECTROPHORESIS, SERUM, WITH REFLEX
Albumin ELP: 4.5 g/dL (ref 3.8–4.8)
Alpha 1: 0.3 g/dL (ref 0.2–0.3)
Alpha 2: 0.6 g/dL (ref 0.5–0.9)
Beta 2: 0.2 g/dL (ref 0.2–0.5)
Beta Globulin: 0.4 g/dL (ref 0.4–0.6)
Gamma Globulin: 0.7 g/dL — ABNORMAL LOW (ref 0.8–1.7)
Total Protein: 6.7 g/dL (ref 6.1–8.1)

## 2020-06-10 LAB — ANGIOTENSIN CONVERTING ENZYME: Angiotensin-Converting Enzyme: 15 U/L (ref 9–67)

## 2020-06-10 LAB — CBC WITH DIFFERENTIAL/PLATELET
Absolute Monocytes: 241 cells/uL (ref 200–950)
Basophils Absolute: 29 cells/uL (ref 0–200)
Basophils Relative: 1 %
Eosinophils Absolute: 119 cells/uL (ref 15–500)
Eosinophils Relative: 4.1 %
HCT: 43.6 % (ref 35.0–45.0)
Hemoglobin: 14.6 g/dL (ref 11.7–15.5)
Lymphs Abs: 1279 cells/uL (ref 850–3900)
MCH: 30.8 pg (ref 27.0–33.0)
MCHC: 33.5 g/dL (ref 32.0–36.0)
MCV: 92 fL (ref 80.0–100.0)
MPV: 12.2 fL (ref 7.5–12.5)
Monocytes Relative: 8.3 %
Neutro Abs: 1233 cells/uL — ABNORMAL LOW (ref 1500–7800)
Neutrophils Relative %: 42.5 %
Platelets: 183 10*3/uL (ref 140–400)
RBC: 4.74 10*6/uL (ref 3.80–5.10)
RDW: 12.2 % (ref 11.0–15.0)
Total Lymphocyte: 44.1 %
WBC: 2.9 10*3/uL — ABNORMAL LOW (ref 3.8–10.8)

## 2020-06-10 LAB — RNP ANTIBODY: Ribonucleic Protein(ENA) Antibody, IgG: <1 AI

## 2020-06-10 LAB — CYCLIC CITRUL PEPTIDE ANTIBODY, IGG: Cyclic Citrullin Peptide Ab: <16 UNITS

## 2020-06-10 LAB — ANA: Anti Nuclear Antibody (ANA): NEGATIVE

## 2020-06-10 LAB — IFE INTERPRETATION: Immunofix Electr Int: NOT DETECTED

## 2020-06-10 LAB — C3 AND C4
C3 Complement: 96 mg/dL (ref 83–193)
C4 Complement: 22 mg/dL (ref 15–57)

## 2020-06-10 LAB — ANTI-SMITH ANTIBODY: ENA SM Ab Ser-aCnc: <1 AI

## 2020-06-10 LAB — CK: Total CK: 105 U/L (ref 29–143)

## 2020-06-10 LAB — ANTI-DNA ANTIBODY, DOUBLE-STRANDED: ds DNA Ab: <1 IU/mL

## 2020-06-10 LAB — RHEUMATOID FACTOR: Rheumatoid fact SerPl-aCnc: <14 IU/mL (ref <14)

## 2020-06-10 LAB — SEDIMENTATION RATE: Sed Rate: 2 mm/h (ref 0–30)

## 2020-06-10 LAB — SJOGRENS SYNDROME-B EXTRACTABLE NUCLEAR ANTIBODY: SSB (La) (ENA) Antibody, IgG: <1 AI

## 2020-06-10 LAB — TSH: TSH: 2.26 mIU/L

## 2020-06-10 LAB — GLUCOSE 6 PHOSPHATE DEHYDROGENASE: G-6PDH: 16.2 U/g Hgb (ref 7.0–20.5)

## 2020-06-13 ENCOUNTER — Encounter: Payer: Self-pay | Admitting: Rheumatology

## 2020-06-13 NOTE — Progress Notes (Signed)
Office Visit Note  Patient: Sheila Lam             Date of Birth: Apr 08, 1966           MRN: [id number]             PCP: Shelda Pal, DO Referring: Shelda Pal* Visit Date: 06/25/2020 Occupation: _0 @  Subjective:  Generalized pain, dry mouth and dry eyes.   History of Present Illness: Sheila Lam is a 54 y.o. female with sicca symptoms, osteoarthritis and fibromyalgia syndrome.  She states she has generalized pain and discomfort.  She also has severe dry mouth and dry eyes.  She has noticed some improvement in her insomnia with supplements which we discussed during the last visit.  Activities of Daily Living:  Patient reports morning stiffness for  all day.   Patient Reports nocturnal pain.  Difficulty dressing/grooming: Denies Difficulty climbing stairs: Reports Difficulty getting out of chair: Denies Difficulty using hands for taps, buttons, cutlery, and/or writing: Reports  Review of Systems  Constitutional: Positive for fatigue.  HENT: Positive for mouth dryness and nose dryness. Negative for mouth sores.        Sore in throat  Eyes: Positive for dryness. Negative for pain, discharge, itching and visual disturbance.  Respiratory: Positive for shortness of breath. Negative for cough and difficulty breathing.   Cardiovascular: Positive for palpitations. Negative for chest pain and swelling in legs/feet.  Gastrointestinal: Positive for blood in stool and constipation. Negative for diarrhea.  Endocrine: Positive for excessive thirst and increased urination.  Genitourinary: Negative for difficulty urinating and painful urination.  Musculoskeletal: Positive for arthralgias, joint pain, myalgias, morning stiffness, muscle tenderness and myalgias. Negative for joint swelling.  Skin: Positive for color change. Negative for rash and redness.  Allergic/Immunologic: Negative for susceptible to infections.  Neurological: Positive for numbness,  headaches, memory loss and weakness. Negative for dizziness.  Hematological: Positive for bruising/bleeding tendency.  Psychiatric/Behavioral: Negative for confusion.    PMFS History:  Patient Active Problem List   Diagnosis Date Noted  . Overweight 04/20/2013  . Celiac disease 04/18/2013  . DJD (degenerative joint disease) 04/18/2013  . S/P hysterectomy 04/18/2013    Past Medical History:  Diagnosis Date  . Anxiety   . Arthritis   . Celiac disease   . 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 Hypertension Father   . Hyperlipidemia Father   . Prostate cancer Father   . Hypothyroidism Mother   . Bladder Cancer Mother   . Leukemia Paternal Grandmother   . Diabetes Paternal Grandmother   . Heart disease Paternal Grandmother   . Hypothyroidism Maternal Grandmother   . Hyperthyroidism Maternal Grandmother   . Heart disease Maternal Grandmother   . Lupus Maternal Grandfather    Past Surgical History:  Procedure Laterality Date  . CESAREAN SECTION  1998  . HERNIA REPAIR  1980's   Per patient  . OTHER SURGICAL HISTORY  1986   Hernia surgery  . PARTIAL HYSTERECTOMY  1996  . TOTAL VAGINAL HYSTERECTOMY  1999   Social History   Social History Narrative  . Not on file   Immunization History  Administered Date(s) Administered  . PFIZER SARS-COV-2 Vaccination 11/08/2019, 12/06/2019  . Tdap 06/14/2019     Objective: Vital Signs: BP 127/77 (BP Location: Left Arm, Patient Position: Sitting, Cuff Size: Normal)   Pulse 64  Resp 15   Ht _0  (1.753 m)   Wt 216 lb 3.2 oz (98.1 kg)   BMI 31.93 kg/m    Physical Exam Vitals and nursing note reviewed.  Constitutional:      Appearance: She is well-developed.  HENT:     Head: Normocephalic and atraumatic.  Eyes:     Conjunctiva/sclera: Conjunctivae normal.  Cardiovascular:     Rate and Rhythm: Normal rate and regular rhythm.     Heart  sounds: Normal heart sounds.  Pulmonary:     Effort: Pulmonary effort is normal.     Breath sounds: Normal breath sounds.  Abdominal:     General: Bowel sounds are normal.     Palpations: Abdomen is soft.  Musculoskeletal:     Cervical back: Normal range of motion.  Lymphadenopathy:     Cervical: No cervical adenopathy.  Skin:    General: Skin is warm and dry.     Capillary Refill: Capillary refill takes less than 2 seconds.  Neurological:     Mental Status: She is alert and oriented to person, place, and time.  Psychiatric:        Behavior: Behavior normal.      Musculoskeletal Exam: C-spine, thoracic and lumbar spine with good range of motion.  Shoulder joints, elbow joints, wrist joints, MCPs and PIPs and DIPs with good range of motion.  She has bilateral PIP and DIP thickening consistent with osteoarthritis.  Hip joints, knee joints, ankles, MTPs and PIPs with good range of motion with no synovitis.  She had tenderness over right trochanteric bursa.  She had generalized hyperalgesia and positive tender points.  CDAI Exam: CDAI Score: -- Patient Global: --; Provider Global: -- Swollen: --; Tender: -- Joint Exam 06/25/2020   No joint exam has been documented for this visit   There is currently no information documented on the homunculus. Go to the Rheumatology activity and complete the homunculus joint exam.  Investigation: No additional findings.  Imaging: DG Chest 2 View  Result Date: 06/05/2020 CLINICAL DATA:  Intermittent shortness of breath for the past 6 months. EXAM: CHEST - 2 VIEW COMPARISON:  Chest x-ray dated September 11, 2017. FINDINGS: The heart size and mediastinal contours are within normal limits. Normal pulmonary vascularity. Mild chronic scarring at the left lung base. Minimal linear atelectasis at the peripheral right lung base. No focal consolidation, pleural effusion, or pneumothorax. No acute osseous abnormality. IMPRESSION: No active cardiopulmonary  disease. Electronically Signed   By: Titus Dubin M.D.   On: 06/05/2020 08:41   XR HIP UNILAT W OR W/O PELVIS 2-3 VIEWS RIGHT  Result Date: 06/04/2020 No SI joint narrowing or sclerosis was noted.  No hip joint narrowing was noted.  No chondrocalcinosis was noted. Impression: Unremarkable x-ray of the hip joint.  XR Hand 2 View Left  Result Date: 06/04/2020 No MCP, PIP or DIP narrowing was noted.  No intercarpal or radiocarpal joint space narrowing was noted.  No erosive changes were noted. Impression: Unremarkable x-ray of the hand.  XR Hand 2 View Right  Result Date: 06/04/2020 No MCP, PIP or DIP narrowing was noted.  No intercarpal or radiocarpal joint space narrowing was noted.  No erosive changes were noted. Impression: Unremarkable x-ray of the hand.  XR KNEE 3 VIEW LEFT  Result Date: 06/04/2020 No medial or lateral compartment narrowing was noted.  Moderate patellofemoral narrowing was noted.  No chondrocalcinosis was noted. 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  Moderate patellofemoral narrowing was noted.  No chondrocalcinosis was noted. 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   Recent Labs: Lab Results  Component Value Date   WBC 2.9 (L) 06/04/2020   HGB 14.6 06/04/2020   PLT 183 06/04/2020   NA 140 06/04/2020   K 4.2 06/04/2020   CL 104 06/04/2020   CO2 27 06/04/2020   GLUCOSE 97 06/04/2020   BUN 16 06/04/2020   CREATININE 0.84 06/04/2020   BILITOT 0.7 06/04/2020   ALKPHOS 53 04/18/2013   AST 20 06/04/2020   ALT 23 06/04/2020   PROT 6.4 06/04/2020   PROT 6.7 06/04/2020   ALBUMIN 4.4 04/18/2013   CALCIUM 9.6 06/04/2020   GFRAA 91 06/04/2020   June 04, 2020 IFE negative, G6PD normal, ANA negative, ENA negative, C3-C4 normal, RF negative, anti-CCP negative, ACE negative, ESR  2, CK normal, TSH normal, G6PD normal  Speciality Comments: No specialty comments available.  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  All autoimmune work-up negative.  I discussed possibility of seronegative Sjogren's.  He cannot use pilocarpine as she has history of bronchial spasms for which she uses albuterol.  I discussed possible salivary gland biopsy.  We finally decided to refer her to [facility] for second opinion.  Pain in both hands - History of intermittent swelling.  No synovitis was noted.  Autoimmune work-up was negative.  Joint protection muscle strengthening was discussed.  Trochanteric bursitis, right hip-she had tenderness on palpation over right trochanteric bursa.  I'll refer her to physical therapy.  Chondromalacia of both patellae-she would benefit from physical therapy.  Primary osteoarthritis of both feet-proper fitting shoes with arch support were discussed.  Fibromyalgia - History of generalized pain.  She had hyperalgesia, positive tender points.  CK normal.  I referred her to physical therapy.  Need for regular exercise, water aerobics and swimming were discussed.  She does yoga.  Other insomnia - tried medications but she could not tolerate in the past.  Over-the-counter products were discussed at the last visit which have been helpful to some extent.  Shortness of breath - Chest x-ray-Mild chronic scarring at the left lungbase. Minimal linear atelectasis at the peripheral right lung base.  She uses albuterol inhaler.  She never had pulmonary evaluation per patient.  I will refer her to pulmonologist for evaluation.  Celiac disease - She is on gluten-free diet.  Anxiety and depression  Seasonal allergies  S/P hysterectomy  Educated about COVID-19 virus infection-she has been advised to get booster  once available to work.  Use of mask, social distancing and hand hygiene was discussed.  We also discussed the use of monoclonal antibody infusion in case she develops COVID-19 infection.  Orders: Orders Placed This Encounter  Procedures  . Ambulatory referral to Rheumatology   No orders of the defined types were placed in this encounter.     Follow-Up Instructions: Return in about 6 months (around 12/24/2020) for Osteoarthritis, sicca, FMS.   Bo Merino, MD  Note - This record has been created using Editor, commissioning.  Chart creation errors have been sought, but may not always  have been located. Such creation errors do not reflect on  the standard of medical care.

## 2020-06-17 ENCOUNTER — Ambulatory Visit: Payer: BC Managed Care – PPO

## 2020-06-25 ENCOUNTER — Ambulatory Visit: Payer: BC Managed Care – PPO | Admitting: Rheumatology

## 2020-06-25 ENCOUNTER — Encounter: Payer: Self-pay | Admitting: Rheumatology

## 2020-06-25 ENCOUNTER — Other Ambulatory Visit: Payer: Self-pay

## 2020-06-25 VITALS — BP 127/77 | HR 64 | Resp 15 | Ht 69.0 in | Wt 216.2 lb

## 2020-06-25 DIAGNOSIS — M79642 Pain in left hand: Secondary | ICD-10-CM

## 2020-06-25 DIAGNOSIS — J302 Other seasonal allergic rhinitis: Secondary | ICD-10-CM

## 2020-06-25 DIAGNOSIS — M19071 Primary osteoarthritis, right ankle and foot: Secondary | ICD-10-CM

## 2020-06-25 DIAGNOSIS — R0602 Shortness of breath: Secondary | ICD-10-CM

## 2020-06-25 DIAGNOSIS — M79641 Pain in right hand: Secondary | ICD-10-CM | POA: Diagnosis not present

## 2020-06-25 DIAGNOSIS — M7061 Trochanteric bursitis, right hip: Secondary | ICD-10-CM

## 2020-06-25 DIAGNOSIS — G4709 Other insomnia: Secondary | ICD-10-CM

## 2020-06-25 DIAGNOSIS — M19072 Primary osteoarthritis, left ankle and foot: Secondary | ICD-10-CM

## 2020-06-25 DIAGNOSIS — M797 Fibromyalgia: Secondary | ICD-10-CM

## 2020-06-25 DIAGNOSIS — M3500 Sicca syndrome, unspecified: Secondary | ICD-10-CM

## 2020-06-25 DIAGNOSIS — M2241 Chondromalacia patellae, right knee: Secondary | ICD-10-CM

## 2020-06-25 DIAGNOSIS — F419 Anxiety disorder, unspecified: Secondary | ICD-10-CM

## 2020-06-25 DIAGNOSIS — F32A Depression, unspecified: Secondary | ICD-10-CM

## 2020-06-25 DIAGNOSIS — K900 Celiac disease: Secondary | ICD-10-CM

## 2020-06-25 DIAGNOSIS — Z90710 Acquired absence of both cervix and uterus: Secondary | ICD-10-CM

## 2020-06-25 DIAGNOSIS — M2242 Chondromalacia patellae, left knee: Secondary | ICD-10-CM

## 2020-06-25 DIAGNOSIS — Z7189 Other specified counseling: Secondary | ICD-10-CM

## 2020-06-25 NOTE — Patient Instructions (Signed)
COVID-19 vaccine recommendations:   COVID-19 vaccine is recommended for everyone (unless you are allergic to a vaccine component), even if you are on a medication that suppresses your immune system.   Do not take Tylenol or any anti-inflammatory medications (NSAIDs) 24 hours prior to the COVID-19 vaccination.   There is no direct evidence about the efficacy of the COVID-19 vaccine in individuals who are on medications that suppress the immune system.   Even if you are fully vaccinated, and you are on any medications that suppress your immune system, please continue to wear a mask, maintain at least six feet social distance and practice hand hygiene.   If you develop a COVID-19 infection, please contact your PCP or our office to determine if you need antibody infusion.  The booster vaccine is now available for immunocompromised patients. It is advised that if you had Pfizer vaccine you should get [employer] booster.  If you had a Moderna vaccine then you should get a Moderna booster. [employer]y does not have a booster vaccine at this time.  Please see the following web sites for updated information.   [email or web address]  [email or web address]

## 2020-06-30 ENCOUNTER — Ambulatory Visit: Payer: BC Managed Care – PPO | Admitting: Rheumatology

## 2020-07-08 ENCOUNTER — Telehealth: Payer: BC Managed Care – PPO | Admitting: Family Medicine

## 2020-07-31 ENCOUNTER — Other Ambulatory Visit: Payer: Self-pay

## 2020-07-31 ENCOUNTER — Telehealth (INDEPENDENT_AMBULATORY_CARE_PROVIDER_SITE_OTHER): Payer: BC Managed Care – PPO | Admitting: Family Medicine

## 2020-07-31 ENCOUNTER — Encounter: Payer: Self-pay | Admitting: Family Medicine

## 2020-07-31 VITALS — Temp 99.5°F

## 2020-07-31 DIAGNOSIS — B9689 Other specified bacterial agents as the cause of diseases classified elsewhere: Secondary | ICD-10-CM | POA: Diagnosis not present

## 2020-07-31 DIAGNOSIS — J208 Acute bronchitis due to other specified organisms: Secondary | ICD-10-CM

## 2020-07-31 MED ORDER — PREDNISONE 20 MG PO TABS: 40.0000 mg | ORAL_TABLET | Freq: Every day | ORAL | 0 refills | Status: AC

## 2020-07-31 MED ORDER — DOXYCYCLINE HYCLATE 100 MG PO TABS: 100.0000 mg | ORAL_TABLET | Freq: Two times a day (BID) | ORAL | 0 refills | Status: DC

## 2020-07-31 MED ORDER — AMOXICILLIN-POT CLAVULANATE 875-125 MG PO TABS: 1.0000 | ORAL_TABLET | Freq: Two times a day (BID) | ORAL | 0 refills | Status: DC

## 2020-07-31 NOTE — Progress Notes (Signed)
Chief Complaint  Patient presents with  . Fever    ears clogged  . Cough  . Nasal Congestion    [place] here for URI complaints. Due to COVID-19 pandemic, we are interacting via web portal for an electronic face-to-face visit. I verified patient's ID using 2 identifiers. Patient agreed to proceed with visit via this method. Patient is at home, I am at office. Patient and I are present for visit.   Duration: 4 days  Associated symptoms: Fever (102 F), sinus congestion, rhinorrhea, myalgia and cough Denies: sinus pain, itchy watery eyes, ear pain, ear drainage, wheezing, shortness of breath and N/V Treatment to date: OTC cold meds, SABA Sick contacts: Yes- student at school tested + for covid  Past Medical History:  Diagnosis Date  . Anxiety   . Arthritis   . Celiac disease   . 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 I verified patient's ID using 2 identifiers. Patient agreed to proceed with visit via this method. Patient is at home, I am at office. Patient and I are present for visit.   Acute bacterial bronchitis - Plan: predniSONE (DELTASONE) 20 MG tablet, amoxicillin-clavulanate (AUGMENTIN) 875-125 MG tablet  5-day prednisone burst which she has taken before in addition to Augmentin.  We will treat for bacterial infection given fevers.  I instructed her to get 1 more Covid test as his blood put the likelihood of having Covid less than 5%. Continue to push fluids, practice good hand hygiene, cover mouth when coughing. F/u prn. If starting to experience fevers, shaking, or shortness of breath, seek immediate care. Pt voiced understanding and agreement to the plan.  Greater than 30 minutes were spent face to face with the patient, reviewing gluten-free friendly medications, reviewing her allergies and intolerances,  and discussing her plan.  [place], DO 07/31/20 4:17 PM

## 2020-09-08 MED ORDER — NEURONTIN 100 MG PO CAPS
100.0000 mg | ORAL_CAPSULE | Freq: Three times a day (TID) | ORAL | 3 refills | Status: DC
Start: 2020-09-08 — End: 2021-04-06

## 2020-10-20 ENCOUNTER — Telehealth: Payer: Self-pay

## 2020-10-20 NOTE — Telephone Encounter (Signed)
Called pt to schedule appointment per [facility] request for abd pain and low grade temp.

## 2020-12-18 NOTE — Progress Notes (Deleted)
Office Visit Note  Patient: Sheila Lam             Date of Birth: 11-Mar-1966           MRN: [id number]             PCP: Shelda Pal, DO Referring: Shelda Pal* Visit Date: 12/31/2020 Occupation: @GUAROCC @  Subjective:  No chief complaint on file.   History of Present Illness: Sheila Lam is a 55 y.o. female ***   Activities of Daily Living:  Patient reports morning stiffness for *** {minute/hour:19697}.   Patient {ACTIONS;DENIES/REPORTS:21021675::"Denies"} nocturnal pain.  Difficulty dressing/grooming: {ACTIONS;DENIES/REPORTS:21021675::"Denies"} Difficulty climbing stairs: {ACTIONS;DENIES/REPORTS:21021675::"Denies"} Difficulty getting out of chair: {ACTIONS;DENIES/REPORTS:21021675::"Denies"} Difficulty using hands for taps, buttons, cutlery, and/or writing: {ACTIONS;DENIES/REPORTS:21021675::"Denies"}  No Rheumatology ROS completed.   PMFS History:  Patient Active Problem List   Diagnosis Date Noted  . Overweight 04/20/2013  . Celiac disease 04/18/2013  . DJD (degenerative joint disease) 04/18/2013  . S/P hysterectomy 04/18/2013    Past Medical History:  Diagnosis Date  . Anxiety   . Arthritis   . Celiac disease   . 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 Hypertension Father   . Hyperlipidemia Father   . Prostate cancer Father   . Hypothyroidism Mother   . Bladder Cancer Mother   . Leukemia Paternal Grandmother   . Diabetes Paternal Grandmother   . Heart disease Paternal Grandmother   . Hypothyroidism Maternal Grandmother   . Hyperthyroidism Maternal Grandmother   . Heart disease Maternal Grandmother   . Lupus Maternal Grandfather    Past Surgical History:  Procedure Laterality Date  . CESAREAN SECTION  1998  . HERNIA REPAIR  1980's   Per patient  . OTHER SURGICAL HISTORY  1986   Hernia surgery  . PARTIAL HYSTERECTOMY  1996  . TOTAL  VAGINAL HYSTERECTOMY  1999   Social History   Social History Narrative  . Not on file   Immunization History  Administered Date(s) Administered  . PFIZER(Purple Top)SARS-COV-2 Vaccination 11/08/2019, 12/06/2019  . Tdap 06/14/2019     Objective: Vital Signs: There were no vitals taken for this visit.   Physical Exam   Musculoskeletal Exam: ***  CDAI Exam: CDAI Score: -- Patient Global: --; Provider Global: -- Swollen: --; Tender: -- Joint Exam 12/31/2020   No joint exam has been documented for this visit   There is currently no information documented on the homunculus. Go to the Rheumatology activity and complete the homunculus joint exam.  Investigation: No additional findings.  Imaging: No results found.  Recent Labs: Lab Results  Component Value Date   WBC 2.9 (L) 06/04/2020   HGB 14.6 06/04/2020   PLT 183 06/04/2020   NA 140 06/04/2020   K 4.2 06/04/2020   CL 104 06/04/2020   CO2 27 06/04/2020   GLUCOSE 97 06/04/2020   BUN 16 06/04/2020   CREATININE 0.84 06/04/2020   BILITOT 0.7 06/04/2020   ALKPHOS 53 04/18/2013   AST 20 06/04/2020   ALT 23 06/04/2020   PROT 6.4 06/04/2020   PROT 6.7 06/04/2020   ALBUMIN 4.4 04/18/2013   CALCIUM 9.6 06/04/2020   GFRAA 91 06/04/2020    Speciality Comments: No specialty comments available.  Procedures:  No procedures performed Allergies: Lactose, Cephalexin, Codeine, Gluten meal, and Soy allergy   Assessment / Plan:     Visit Diagnoses: No  diagnosis found.  Orders: No orders of the defined types were placed in this encounter.  No orders of the defined types were placed in this encounter.   Face-to-face time spent with patient was *** minutes. Greater than 50% of time was spent in counseling and coordination of care.  Follow-Up Instructions: No follow-ups on file.   Earnestine Mealing, CMA  Note - This record has been created using Editor, commissioning.  Chart creation errors have been sought, but may not  always  have been located. Such creation errors do not reflect on  the standard of medical care.

## 2020-12-31 ENCOUNTER — Ambulatory Visit: Payer: BC Managed Care – PPO | Admitting: Rheumatology

## 2020-12-31 DIAGNOSIS — M797 Fibromyalgia: Secondary | ICD-10-CM

## 2020-12-31 DIAGNOSIS — Z90710 Acquired absence of both cervix and uterus: Secondary | ICD-10-CM

## 2020-12-31 DIAGNOSIS — K900 Celiac disease: Secondary | ICD-10-CM

## 2020-12-31 DIAGNOSIS — R0602 Shortness of breath: Secondary | ICD-10-CM

## 2020-12-31 DIAGNOSIS — M3500 Sicca syndrome, unspecified: Secondary | ICD-10-CM

## 2020-12-31 DIAGNOSIS — J302 Other seasonal allergic rhinitis: Secondary | ICD-10-CM

## 2020-12-31 DIAGNOSIS — F32A Depression, unspecified: Secondary | ICD-10-CM

## 2020-12-31 DIAGNOSIS — M7061 Trochanteric bursitis, right hip: Secondary | ICD-10-CM

## 2020-12-31 DIAGNOSIS — M79641 Pain in right hand: Secondary | ICD-10-CM

## 2020-12-31 DIAGNOSIS — G4709 Other insomnia: Secondary | ICD-10-CM

## 2020-12-31 DIAGNOSIS — M19071 Primary osteoarthritis, right ankle and foot: Secondary | ICD-10-CM

## 2020-12-31 DIAGNOSIS — M2241 Chondromalacia patellae, right knee: Secondary | ICD-10-CM

## 2021-01-20 ENCOUNTER — Ambulatory Visit: Payer: BC Managed Care – PPO | Admitting: Family Medicine

## 2021-01-21 IMAGING — MG DIGITAL SCREENING BILAT W/ TOMO W/ CAD
8 series · 8 of 24 positions shown · non-contrast
Comparison: Previous exam(s).

CLINICAL DATA: Screening.

EXAM:
DIGITAL SCREENING BILATERAL MAMMOGRAM WITH TOMO AND CAD

[R CC synth-2D]
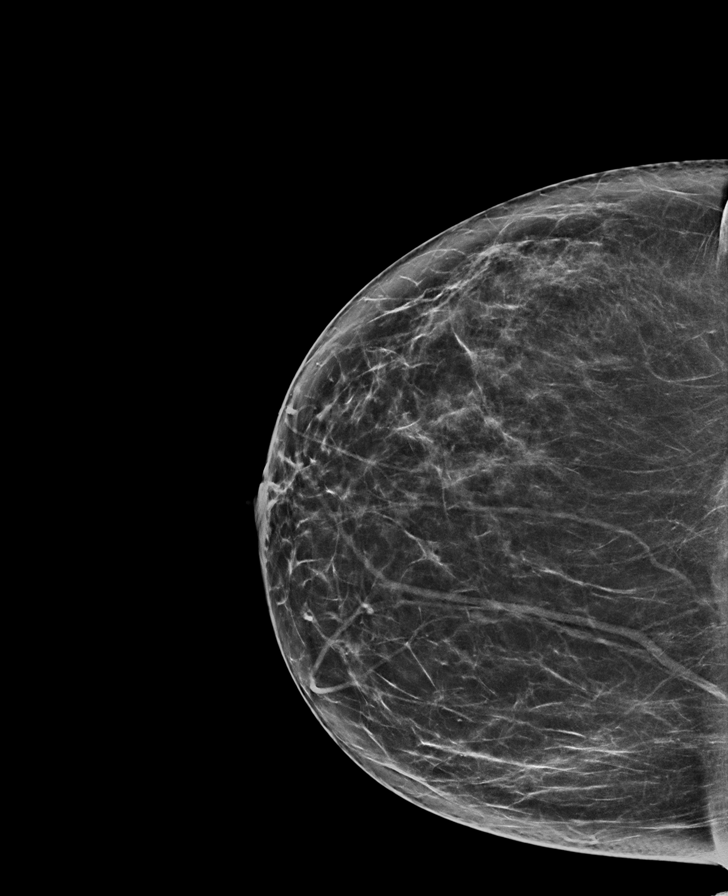

[R MLO synth-2D]
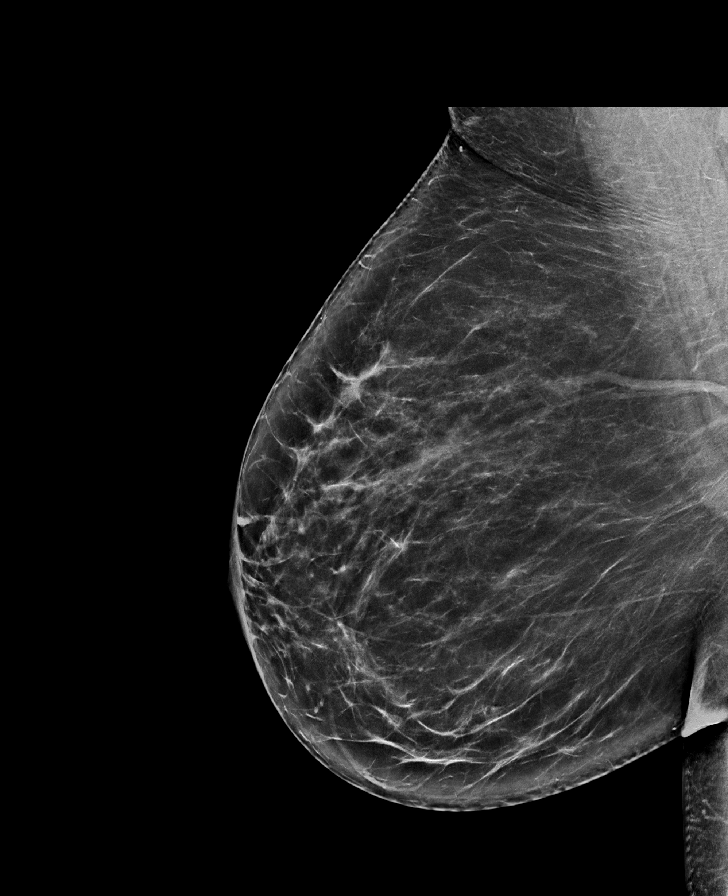

[L CC synth-2D]
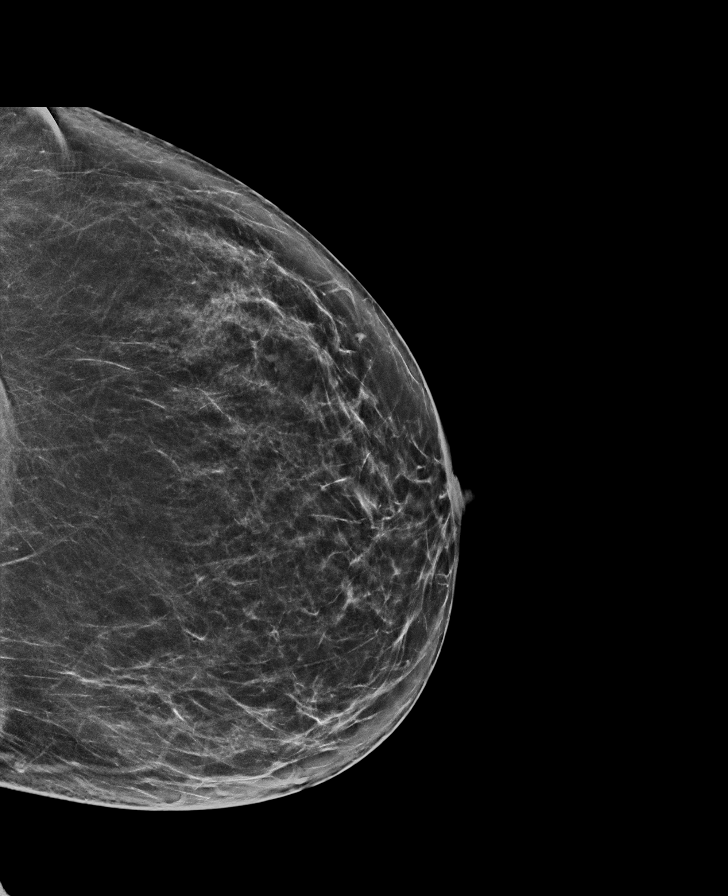

[L MLO synth-2D]
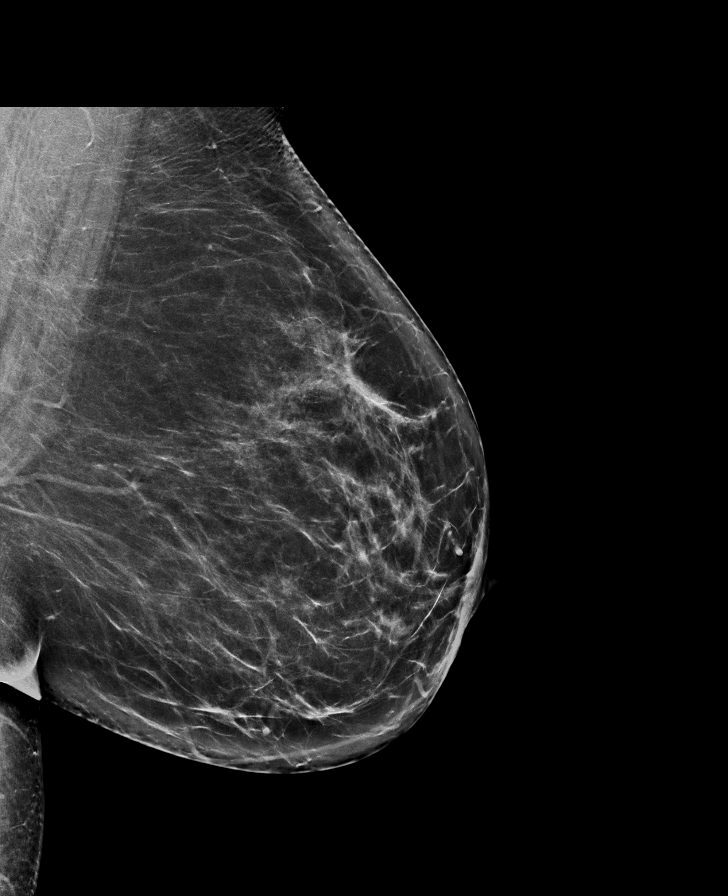

[L CC tomo · tomo slice 35/70.0]
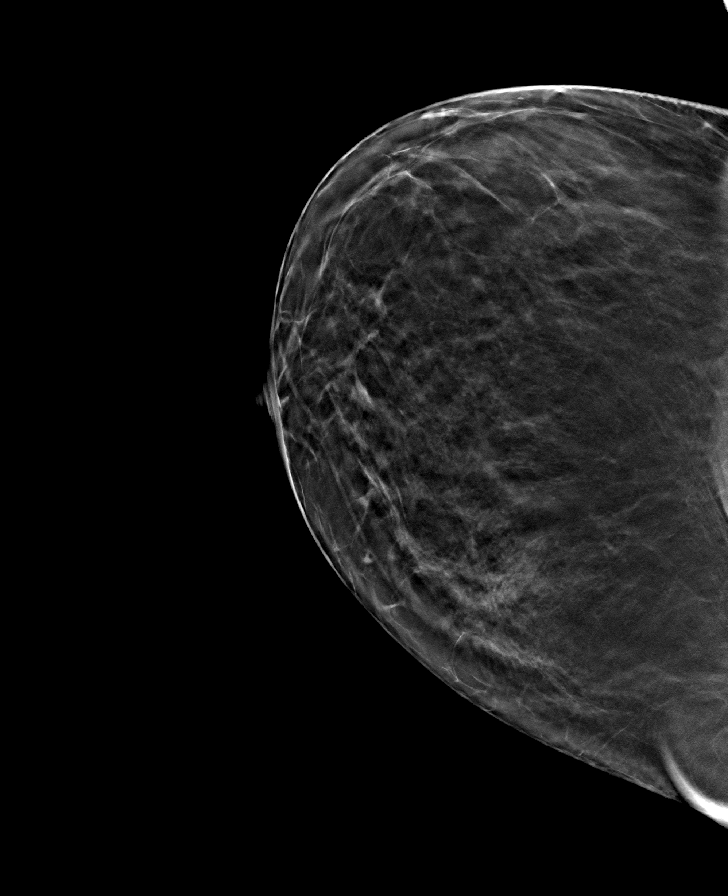

[R CC tomo · tomo slice 37/74.0]
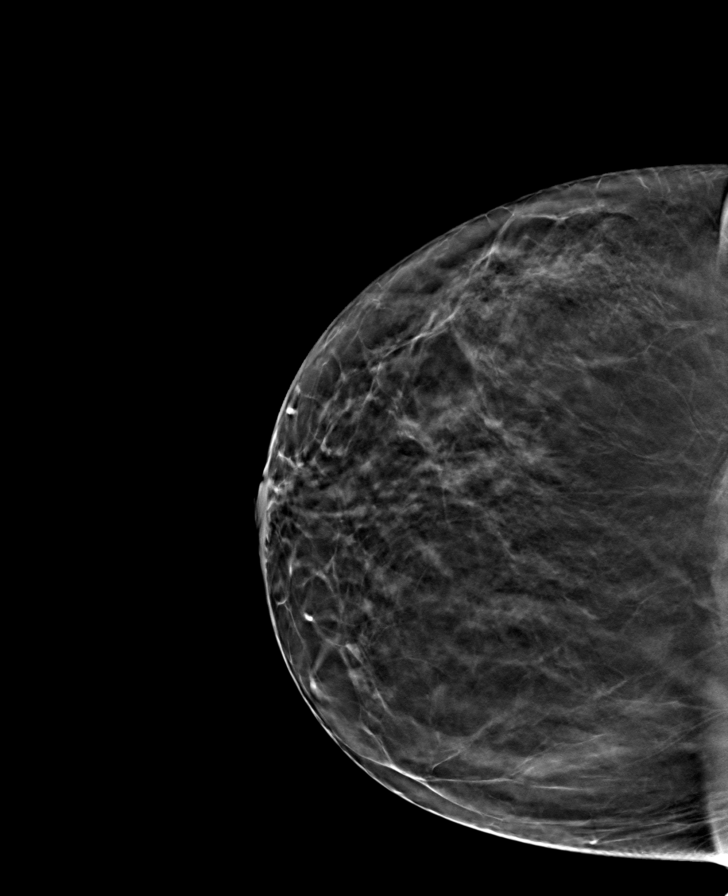

[R MLO tomo · tomo slice 43/84.0]
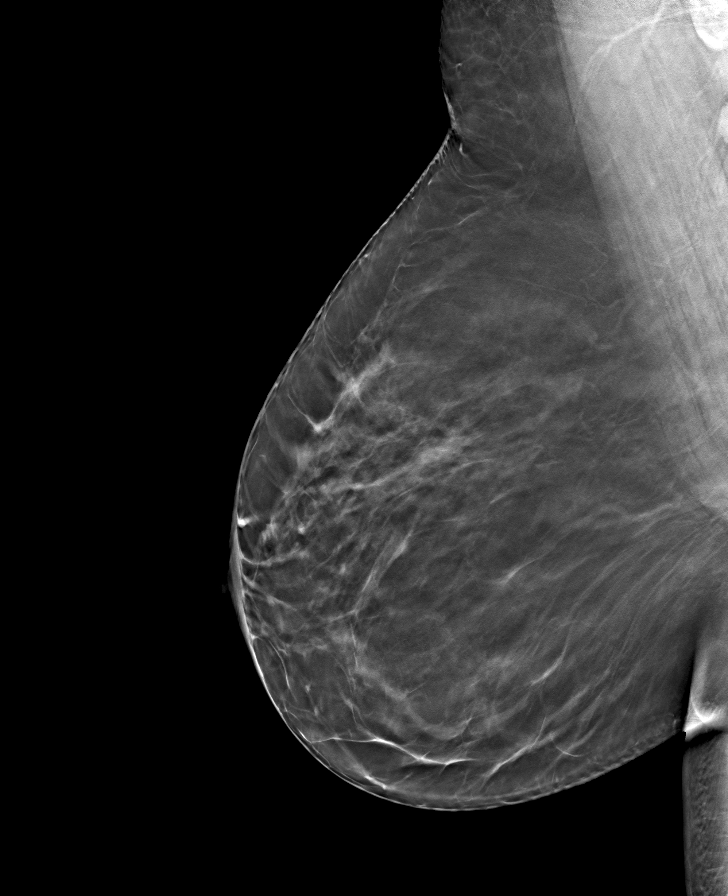

[L MLO tomo · tomo slice 42/83.0]
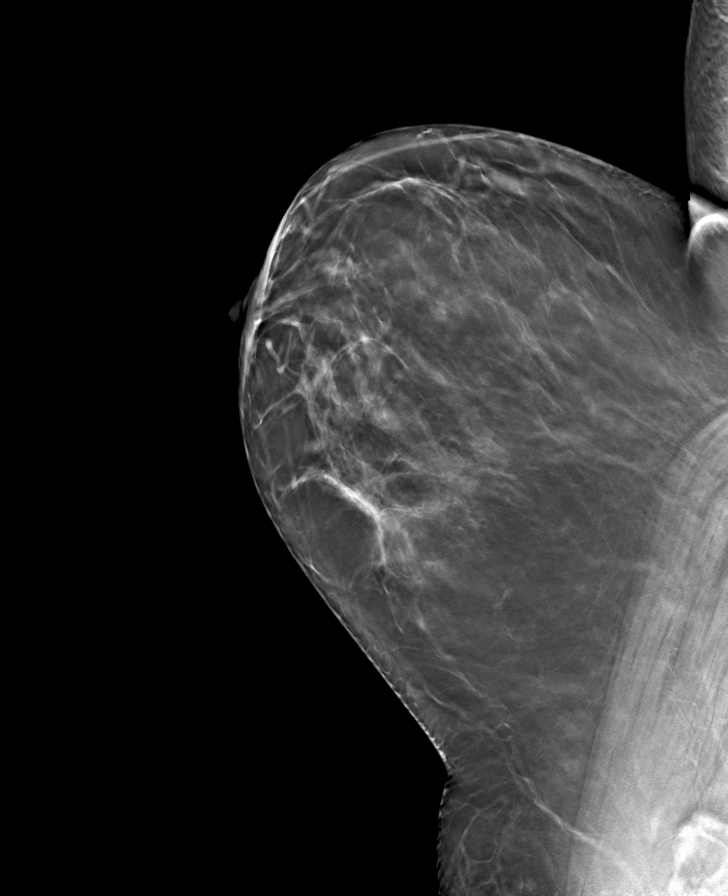

[8 of 24 positions shown; findings below may reference images not displayed]

ACR Breast Density Category b: There are scattered areas of
fibroglandular density.
FINDINGS: There are no findings suspicious for malignancy. Images were
processed with CAD.
IMPRESSION: No mammographic evidence of malignancy. A result letter of this
screening mammogram will be mailed directly to the patient.

RECOMMENDATION:
Screening mammogram in one year. (Code:[id number])

BI-RADS CATEGORY  1: Negative.

## 2021-01-26 ENCOUNTER — Ambulatory Visit: Payer: BC Managed Care – PPO | Admitting: Family Medicine

## 2021-01-26 ENCOUNTER — Encounter: Payer: Self-pay | Admitting: Family Medicine

## 2021-01-26 ENCOUNTER — Other Ambulatory Visit: Payer: Self-pay

## 2021-01-26 VITALS — BP 132/84 | HR 72 | Temp 98.4°F | Resp 16 | Ht 69.0 in | Wt 217.5 lb

## 2021-01-26 DIAGNOSIS — E6609 Other obesity due to excess calories: Secondary | ICD-10-CM | POA: Diagnosis not present

## 2021-01-26 DIAGNOSIS — Z0000 Encounter for general adult medical examination without abnormal findings: Secondary | ICD-10-CM

## 2021-01-26 DIAGNOSIS — Z1159 Encounter for screening for other viral diseases: Secondary | ICD-10-CM

## 2021-01-26 DIAGNOSIS — E559 Vitamin D deficiency, unspecified: Secondary | ICD-10-CM | POA: Diagnosis not present

## 2021-01-26 DIAGNOSIS — E7800 Pure hypercholesterolemia, unspecified: Secondary | ICD-10-CM

## 2021-01-26 DIAGNOSIS — M3500 Sicca syndrome, unspecified: Secondary | ICD-10-CM | POA: Diagnosis not present

## 2021-01-26 DIAGNOSIS — Z6832 Body mass index (BMI) 32.0-32.9, adult: Secondary | ICD-10-CM

## 2021-01-26 MED ORDER — ALBUTEROL SULFATE HFA 108 (90 BASE) MCG/ACT IN AERS: 2.0000 | INHALATION_SPRAY | Freq: Four times a day (QID) | RESPIRATORY_TRACT | 2 refills | Status: DC | PRN

## 2021-01-26 NOTE — Patient Instructions (Signed)
Give [place] 2-3 business days to get the results of your labs back.   If you do not hear anything about your referral in the next 1-2 weeks, call our office and ask for an update.  Keep the diet clean and stay active.  Aim to do some physical exertion for 150 minutes per week. This is typically divided into 5 days per week, 30 minutes per day. The activity should be enough to get your heart rate up. Anything is better than nothing if you have time constraints. Add some weight resistance exercise to your regimen.   Let us know if you need anything.

## 2021-01-26 NOTE — Progress Notes (Signed)
Chief Complaint  Patient presents with  . Annual Exam    Discuss weight loss options     Well Woman Sheila Lam is here for a complete physical.   Her last physical was >1 year ago.  Current diet: in general, a "healthy" diet. Current exercise: yoga. Weight is stable and she denies new fatigue out of ordinary. Seatbelt? Yes  Health Maintenance Mammogram- Yes Colon cancer screening-Yes Shingrix- No Tetanus- Yes Hep C screening- No HIV screening- Yes  Past Medical History:  Diagnosis Date  . Anxiety   . Arthritis   . Celiac disease   . 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 CESAREAN SECTION  1998  . HERNIA REPAIR  1980's   Per patient  . OTHER SURGICAL HISTORY  1986   Hernia surgery  . PARTIAL HYSTERECTOMY  1996  . TOTAL VAGINAL HYSTERECTOMY  1999    Medications  Current Outpatient Medications on File Prior to Visit  Medication Sig Dispense Refill  . NEURONTIN 100 MG capsule Take 1 capsule (100 mg total) by mouth 3 (three) times daily. 90 capsule 3   Allergies Allergies  Allergen Reactions  . Lactose Dermatitis, Diarrhea and Shortness Of Breath  . Cephalexin   . Codeine Nausea And Vomiting  . Gluten Meal     Celiac disease  . Soy Allergy     Breast swelling    Review of Systems: Constitutional:  no unexpected weight changes Eye:  no recent significant change in vision Ear/Nose/Mouth/Throat:  Ears:  no recent change in hearing Nose/Mouth/Throat:  no complaints of nasal congestion, no sore throat Cardiovascular: no chest pain Respiratory:  no shortness of breath Gastrointestinal:  +chronic bloating/pain GU:  Female: negative for dysuria or pelvic pain Musculoskeletal/Extremities:  no pain of the joints Integumentary (Skin/Breast):  no abnormal skin lesions reported Neurologic:  no headaches Endocrine:  denies fatigue  Exam BP 132/84 (BP Location: Left Arm,  Patient Position: Sitting, Cuff Size: Normal)   Pulse 72   Temp 98.4 F (36.9 C) (Oral)   Resp 16   Ht 5' 9"  (1.753 m)   Wt 217 lb 8 oz (98.7 kg)   SpO2 98%   BMI 32.12 kg/m  General:  well developed, well nourished, in no apparent distress Skin:  no significant moles, warts, or growths Head:  no masses, lesions, or tenderness Eyes:  pupils equal and round, sclera anicteric without injection Ears:  canals without lesions, TMs shiny without retraction, no obvious effusion, no erythema Nose:  nares patent, septum midline, mucosa normal, and no drainage or sinus tenderness Throat/Pharynx:  lips and gingiva without lesion; tongue and uvula midline; non-inflamed pharynx; no exudates or postnasal drainage Neck: neck supple without adenopathy, thyromegaly, or masses Lungs:  clear to auscultation, breath sounds equal bilaterally, no respiratory distress Cardio:  regular rate and rhythm, no LE edema Abdomen:  abdomen soft, mild ttp in epigastric region; bowel sounds normal; no masses or organomegaly Genital: Defer to GYN Musculoskeletal:  symmetrical muscle groups noted without atrophy or deformity Extremities:  no clubbing, cyanosis, or edema, no deformities, no skin discoloration Neuro:  gait normal; deep tendon reflexes normal and symmetric Psych: well oriented with normal range of affect and appropriate judgment/insight  Assessment and Plan  Well adult exam - Plan: CBC, Comprehensive metabolic panel, Lipid panel, VITAMIN D 25 Hydroxy (Vit-D Deficiency, Fractures), TSH  Sicca complex ([place])  Class 1 obesity due to excess calories without serious comorbidity with body mass index (BMI) of 32.0 to 32.9 in adult - Plan: Amb Ref to Medical Weight Management  Encounter for hepatitis C screening test for low risk patient - Plan: Hepatitis C antibody   Well 55 y.o. female. Counseled on diet and exercise. Other orders as above. Follow up in 1 yr or prn. The patient voiced understanding and  agreement to the plan.  [place], DO 01/26/21 4:01 PM

## 2021-01-27 LAB — COMPREHENSIVE METABOLIC PANEL
ALT: 26 U/L (ref 0–35)
AST: 19 U/L (ref 0–37)
Albumin: 4.6 g/dL (ref 3.5–5.2)
Alkaline Phosphatase: 59 U/L (ref 39–117)
BUN: 14 mg/dL (ref 6–23)
CO2: 27 mEq/L (ref 19–32)
Calcium: 9.5 mg/dL (ref 8.4–10.5)
Chloride: 104 mEq/L (ref 96–112)
Creatinine, Ser: 0.8 mg/dL (ref 0.40–1.20)
GFR: 83.05 mL/min (ref >60.00)
Glucose, Bld: 83 mg/dL (ref 70–99)
Potassium: 3.8 mEq/L (ref 3.5–5.1)
Sodium: 140 mEq/L (ref 135–145)
Total Bilirubin: 0.6 mg/dL (ref 0.2–1.2)
Total Protein: 6.9 g/dL (ref 6.0–8.3)

## 2021-01-27 LAB — CBC
HCT: 41.6 % (ref 36.0–46.0)
Hemoglobin: 14.4 g/dL (ref 12.0–15.0)
MCHC: 34.5 g/dL (ref 30.0–36.0)
MCV: 90.1 fl (ref 78.0–100.0)
Platelets: 185 10*3/uL (ref 150.0–400.0)
RBC: 4.61 Mil/uL (ref 3.87–5.11)
RDW: 13 % (ref 11.5–15.5)
WBC: 4.1 10*3/uL (ref 4.0–10.5)

## 2021-01-27 LAB — LIPID PANEL
Cholesterol: 205 mg/dL — ABNORMAL HIGH (ref 0–200)
HDL: 58.3 mg/dL (ref >39.00)
LDL Cholesterol: 127 mg/dL — ABNORMAL HIGH (ref 0–99)
NonHDL: 146.24
Total CHOL/HDL Ratio: 4
Triglycerides: 95 mg/dL (ref 0.0–149.0)
VLDL: 19 mg/dL (ref 0.0–40.0)

## 2021-01-27 LAB — HEPATITIS C ANTIBODY
Hepatitis C Ab: NONREACTIVE
SIGNAL TO CUT-OFF: 0 (ref <1.00)

## 2021-01-27 LAB — TSH: TSH: 1.69 u[IU]/mL (ref 0.35–4.50)

## 2021-01-27 LAB — VITAMIN D 25 HYDROXY (VIT D DEFICIENCY, FRACTURES): VITD: 27.12 ng/mL — ABNORMAL LOW (ref 30.00–100.00)

## 2021-04-06 ENCOUNTER — Encounter (INDEPENDENT_AMBULATORY_CARE_PROVIDER_SITE_OTHER): Payer: Self-pay | Admitting: Family Medicine

## 2021-04-06 ENCOUNTER — Ambulatory Visit (INDEPENDENT_AMBULATORY_CARE_PROVIDER_SITE_OTHER): Payer: BC Managed Care – PPO | Admitting: Family Medicine

## 2021-04-06 ENCOUNTER — Other Ambulatory Visit: Payer: Self-pay

## 2021-04-06 VITALS — BP 122/81 | HR 65 | Temp 98.0°F | Ht 69.0 in | Wt 229.0 lb

## 2021-04-06 DIAGNOSIS — Z0289 Encounter for other administrative examinations: Secondary | ICD-10-CM

## 2021-04-06 DIAGNOSIS — E739 Lactose intolerance, unspecified: Secondary | ICD-10-CM

## 2021-04-06 DIAGNOSIS — IMO0002 Reserved for concepts with insufficient information to code with codable children: Secondary | ICD-10-CM

## 2021-04-06 DIAGNOSIS — Z6833 Body mass index (BMI) 33.0-33.9, adult: Secondary | ICD-10-CM

## 2021-04-06 DIAGNOSIS — F39 Unspecified mood [affective] disorder: Secondary | ICD-10-CM

## 2021-04-06 DIAGNOSIS — R0602 Shortness of breath: Secondary | ICD-10-CM

## 2021-04-06 DIAGNOSIS — M797 Fibromyalgia: Secondary | ICD-10-CM

## 2021-04-06 DIAGNOSIS — J42 Unspecified chronic bronchitis: Secondary | ICD-10-CM | POA: Diagnosis not present

## 2021-04-06 DIAGNOSIS — Z9189 Other specified personal risk factors, not elsewhere classified: Secondary | ICD-10-CM | POA: Diagnosis not present

## 2021-04-06 DIAGNOSIS — Z1331 Encounter for screening for depression: Secondary | ICD-10-CM | POA: Diagnosis not present

## 2021-04-06 DIAGNOSIS — E669 Obesity, unspecified: Secondary | ICD-10-CM

## 2021-04-06 DIAGNOSIS — R5383 Other fatigue: Secondary | ICD-10-CM

## 2021-04-07 LAB — INSULIN, RANDOM: INSULIN: 15.1 u[IU]/mL (ref 2.6–24.9)

## 2021-04-07 LAB — FOLATE: Folate: 3.2 ng/mL (ref >3.0)

## 2021-04-07 LAB — VITAMIN B12: Vitamin B-12: 243 pg/mL (ref 232–1245)

## 2021-04-07 LAB — HEMOGLOBIN A1C
Est. average glucose Bld gHb Est-mCnc: 105 mg/dL
Hgb A1c MFr Bld: 5.3 % (ref 4.8–5.6)

## 2021-04-11 NOTE — Progress Notes (Signed)
Dear Dr. Riki Sheer,   Thank you for referring Sheila Lam to our clinic. The following note includes my evaluation and treatment recommendations.  Chief Complaint:   Sheila Lam (MR# [id number]) is a 55 y.o. female who presents for evaluation and treatment of obesity and related comorbidities. Current BMI is Body mass index is 33.82 kg/m. Sheila Lam has been struggling with her weight for many years and has been unsuccessful in either losing weight, maintaining weight loss, or reaching her healthy weight goal.  Sheila Lam is currently in the action stage of change and ready to dedicate time achieving and maintaining a healthier weight. Sheila Lam is interested in becoming our patient and working on intensive lifestyle modifications including (but not limited to) diet and exercise for weight loss.  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 She lives with her husband, Sheila Lam. She tried Weight Watchers and exercise in the past, which worked best. She craves "nothing". She skips lunch when working. Pt drinks caloric beverages. She is using [employer]itness[employer] currently.  Sheila Lam's habits were reviewed today and are as follows: Her family eats meals together, she thinks her family will eat healthier with her, her desired weight loss is 69 lbs, she has been heavy most of her life, she started gaining weight after her hysterectomy, her heaviest weight ever was 246 pounds, she skips meals frequently, she is frequently drinking liquids with calories, she has problems with excessive hunger, she frequently eats larger portions than normal, and she struggles with emotional eating.  Depression Screen Sheila Lam's Food and Mood (modified PHQ-9) score was 5.  Depression screen PHQ 2/9 04/06/2021  Decreased Interest 1  Down, Depressed, Hopeless 1  PHQ - 2 Score 2  Altered sleeping 0  Tired, decreased energy 1  Change in appetite 1  Feeling bad or failure about yourself  1  Trouble  concentrating 0  Moving slowly or fidgety/restless 0  Suicidal thoughts 0  PHQ-9 Score 5  Difficult doing work/chores Not difficult at all   Subjective:   1. Other fatigue Sheila Lam admits to daytime somnolence and admits to waking up still tired. Patent has a history of symptoms of morning fatigue and morning headache. Sheila Lam generally gets 2 or 3 hours (at a time) of sleep per night, and states that she has poor sleep quality. Snoring is not present. Apneic episodes are not present. Epworth Sleepiness Score is 4.  2. Shortness of breath on exertion Sheila Lam notes increasing shortness of breath with exercising and seems to be worsening over time with weight gain. She notes getting out of breath sooner with activity than she used to. This has gotten worse recently. Sheila Lam denies shortness of breath at rest or orthopnea.  3. 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 Her PHQ-9 score is 4. She has celiac disease. Only when she is exposed to gluten, lactose, or soy does she have depression or anxiety.  4. Fibromyalgia Sheila Lam has chronic fatigue syndrome. She was diagnosed by rheumatologist, Sheila Lam, this past January 2022.   5. Lactose disaccharidase deficiency She is unable to eat many dairy products. Pt is aware of what she can tolerate and what she cannot.  6. At risk for impaired metabolic function Sheila Lam is at increased risk for impaired metabolic function due to current nutrition and muscle mass.  Assessment/Plan:   Orders Placed This Encounter  Procedures   Vitamin B12   Folate   Hemoglobin A1c  Insulin, random   EKG 12-Lead    Medications Discontinued During This Encounter  Medication Reason   NEURONTIN 100 MG capsule Error     No orders of the defined types were placed in this encounter.    1. Other fatigue Sheila Lam does feel that her weight is causing her energy to be lower than it should be. Fatigue may be  related to obesity, depression or many other causes. Labs will be ordered, and in the meanwhile, Sheila Lam will focus on self care including making healthy food choices, increasing physical activity and focusing on stress reduction. Check labs today.  - Vitamin B12 - EKG 12-Lead - Folate - Hemoglobin A1c - Insulin, random  2. Shortness of breath on exertion Sheila Lam does feel that she gets out of breath more easily that she used to when she exercises. Sheila Lam shortness of breath appears to be obesity related and exercise induced. She has agreed to work on weight loss and gradually increase exercise to treat her exercise induced shortness of breath. Will continue to monitor closely. Check labs today.  - Vitamin B12 - Folate - Hemoglobin A1c - Insulin, random  3. 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 Pt is to avoid triggers. We will monitor closely.  4. Fibromyalgia Continue with rheumatology for treatment. Weight loss via prudent nutritional plan and eventually increase activity as tolerated.  5. Lactose disaccharidase deficiency Avoid triggers! Pt is to do journaling in order to have complete control over food choices.  6. Screening for depression Sheila Lam had a positive depression screening. Depression is commonly associated with obesity and often results in emotional eating behaviors. We will monitor this closely and work on CBT to help improve the non-hunger eating patterns. Referral to Psychology may be required if no improvement is seen as she continues in our clinic.  7. At risk for impaired metabolic function Sheila Lam was given approximately 23 minutes of impaired  metabolic function prevention counseling today. We discussed intensive lifestyle modifications today with an emphasis on specific nutrition and exercise instructions and strategies.   Repetitive spaced learning was employed today to elicit superior memory formation and behavioral change.  8. Obesity  with current BMI of 33.9  Sheila Lam is currently in the action stage of change and her goal is to continue with weight loss efforts. I recommend Sheila Lam begin the structured treatment plan as follows:  She has agreed to keeping a food journal and adhering to recommended goals of 1200-1300 calories and 90 grams protein.  Exercise goals:  As is    Behavioral modification strategies: meal planning and cooking strategies, keeping healthy foods in the home, and planning for success.  She was informed of the importance of frequent follow-up visits to maximize her success with intensive lifestyle modifications for her multiple health conditions. She was informed we would discuss her lab results at her next visit unless there is a critical issue that needs to be addressed sooner. Sheila Lam agreed to keep her next visit at the agreed upon time to discuss these results.  Objective:   Blood pressure 122/81, pulse 65, temperature 98 F (36.7 C), height 5' 9"  (1.753 m), weight 229 lb (103.9 kg), SpO2 97 %. Body mass index is 33.82 kg/m.  EKG: Sinus rhythm, rate 71. Abnormal EKG- Low voltage in precordial leads- anterior infarct, age undetermined.   Indirect Calorimeter completed today shows a VO2 of 242 and a REE of 1670.  Her calculated basal metabolic rate is 5284 thus her basal metabolic rate is  worse than expected.  General: Cooperative, alert, well developed, in no acute distress. HEENT: Conjunctivae and lids unremarkable. Cardiovascular: Regular rhythm.  Lungs: Normal work of breathing. Neurologic: No focal deficits.   Lab Results  Component Value Date   CREATININE 0.80 01/26/2021   BUN 14 01/26/2021   NA 140 01/26/2021   K 3.8 01/26/2021   CL 104 01/26/2021   CO2 27 01/26/2021   Lab Results  Component Value Date   ALT 26 01/26/2021   AST 19 01/26/2021   ALKPHOS 59 01/26/2021   BILITOT 0.6 01/26/2021   Lab Results  Component Value Date   HGBA1C 5.3 04/06/2021   Lab Results   Component Value Date   INSULIN 15.1 04/06/2021   Lab Results  Component Value Date   TSH 1.69 01/26/2021   Lab Results  Component Value Date   CHOL 205 (H) 01/26/2021   HDL 58.30 01/26/2021   LDLCALC 127 (H) 01/26/2021   TRIG 95.0 01/26/2021   CHOLHDL 4 01/26/2021   Lab Results  Component Value Date   WBC 4.1 01/26/2021   HGB 14.4 01/26/2021   HCT 41.6 01/26/2021   MCV 90.1 01/26/2021   PLT 185.0 01/26/2021   Lab Results  Component Value Date   IRON 132 06/14/2019   TIBC 265 06/14/2019   FERRITIN 285 (H) 06/14/2019    Attestation Statements:   Reviewed by clinician on day of visit: allergies, medications, problem list, medical history, surgical history, family history, social history, and previous encounter notes.  Coral Ceo, CMA, am acting as transcriptionist for [employer], DO.  I have reviewed the above documentation for accuracy and completeness, and I agree with the above. Marjory Sneddon, D.O.  The [place] was signed into law in 2016 which includes the topic of electronic health records.  This provides immediate access to information in MyChart.  This includes consultation notes, operative notes, office notes, lab results and pathology reports.  If you have any questions about what you read please let us know at your next visit so we can discuss your concerns and take corrective action if need be.  We are right here with you.

## 2021-04-12 ENCOUNTER — Telehealth: Payer: BC Managed Care – PPO | Admitting: Nurse Practitioner

## 2021-04-12 DIAGNOSIS — J0140 Acute pansinusitis, unspecified: Secondary | ICD-10-CM

## 2021-04-12 MED ORDER — FLUTICASONE PROPIONATE 50 MCG/ACT NA SUSP: 2.0000 | Freq: Every day | NASAL | 0 refills | Status: DC

## 2021-04-12 MED ORDER — AMOXICILLIN-POT CLAVULANATE 875-125 MG PO TABS: 1.0000 | ORAL_TABLET | Freq: Two times a day (BID) | ORAL | 0 refills | Status: AC

## 2021-04-12 NOTE — Progress Notes (Signed)
E-Visit for Sinus Problems  We are sorry that you are not feeling well.  Here is how we plan to help!  Based on what you have shared with me it looks like you have sinusitis.  Sinusitis is inflammation and infection in the sinus cavities of the head.  Based on your presentation I believe you most likely have Acute Bacterial Sinusitis.  This is an infection caused by bacteria and is treated with antibiotics. I have prescribed Augmentin 886m/125mg one tablet twice daily with food, for 7 days. You may use an oral decongestant such as Mucinex D or if you have glaucoma or high blood pressure use plain Mucinex. Saline nasal spray help and can safely be used as often as needed for congestion.  If you develop worsening sinus pain, fever or notice severe headache and vision changes, or if symptoms are not better after completion of antibiotic, please schedule an appointment with a health care provider.    Sinus infections are not as easily transmitted as other respiratory infection, however we still recommend that you avoid close contact with loved ones, especially the very young and elderly.  Remember to wash your hands thoroughly throughout the day as this is the number one way to prevent the spread of infection!  Home Care: Only take medications as instructed by your medical team. Complete the entire course of an antibiotic. Do not take these medications with alcohol. A steam or ultrasonic humidifier can help congestion.  You can place a towel over your head and breathe in the steam from hot water coming from a faucet. Avoid close contacts especially the very young and the elderly. Cover your mouth when you cough or sneeze. Always remember to wash your hands.  Get Help Right Away If: You develop worsening fever or sinus pain. You develop a severe head ache or visual changes. Your symptoms persist after you have completed your treatment plan.  Make sure you Understand these instructions. Will watch  your condition. Will get help right away if you are not doing well or get worse.  Thank you for choosing an e-visit.  Your e-visit answers were reviewed by a board certified advanced clinical practitioner to complete your personal care plan. Depending upon the condition, your plan could have included both over the counter or prescription medications.  Please review your pharmacy choice. Make sure the pharmacy is open so you can pick up prescription now. If there is a problem, you may contact your provider through [employer] have the prescription routed to another pharmacy.  Your safety is important to [place] If you have drug allergies check your prescription carefully.   For the next 24 hours you can use MyChart to ask questions about today's visit, request a non-urgent call back, or ask for a work or school excuse. You will get an email in the next two days asking about your experience. I hope that your e-visit has been valuable and will speed your recovery.   I spent approximately 7 minutes reviewing the patient's history, current symptoms and coordinating their plan of care today.    Meds ordered this encounter  Medications   amoxicillin-clavulanate (AUGMENTIN) 875-125 MG tablet    Sig: Take 1 tablet by mouth 2 (two) times daily for 7 days. Take with food    Dispense:  14 tablet    Refill:  0   fluticasone (FLONASE) 50 MCG/ACT nasal spray    Sig: Place 2 sprays into both nostrils daily.    Dispense:  16 g    Refill:  0

## 2021-04-16 ENCOUNTER — Encounter (INDEPENDENT_AMBULATORY_CARE_PROVIDER_SITE_OTHER): Payer: Self-pay | Admitting: Family Medicine

## 2021-04-19 NOTE — Telephone Encounter (Signed)
Last OV with Dr Raliegh Scarlet

## 2021-04-20 ENCOUNTER — Ambulatory Visit (INDEPENDENT_AMBULATORY_CARE_PROVIDER_SITE_OTHER): Payer: Self-pay | Admitting: Family Medicine

## 2021-04-21 ENCOUNTER — Telehealth (INDEPENDENT_AMBULATORY_CARE_PROVIDER_SITE_OTHER): Payer: Self-pay

## 2021-04-21 NOTE — Telephone Encounter (Signed)
Tried reaching out to patient x 3 regarding her concerns and was unsuccessful. Left a message for the patient to return my call.    Jewelianna Pancoast, [place]

## 2021-05-03 ENCOUNTER — Encounter: Payer: Self-pay | Admitting: Family Medicine

## 2021-06-08 ENCOUNTER — Other Ambulatory Visit: Payer: Self-pay | Admitting: Family Medicine

## 2021-06-08 DIAGNOSIS — K900 Celiac disease: Secondary | ICD-10-CM

## 2021-06-08 DIAGNOSIS — Z1211 Encounter for screening for malignant neoplasm of colon: Secondary | ICD-10-CM

## 2021-06-28 ENCOUNTER — Telehealth: Payer: BC Managed Care – PPO | Admitting: Family Medicine

## 2021-06-28 DIAGNOSIS — J0140 Acute pansinusitis, unspecified: Secondary | ICD-10-CM | POA: Diagnosis not present

## 2021-06-28 MED ORDER — AMOXICILLIN-POT CLAVULANATE 875-125 MG PO TABS: 1.0000 | ORAL_TABLET | Freq: Two times a day (BID) | ORAL | 0 refills | Status: AC

## 2021-06-28 MED ORDER — FLUTICASONE PROPIONATE 50 MCG/ACT NA SUSP: 2.0000 | Freq: Every day | NASAL | 0 refills | Status: DC

## 2021-06-28 NOTE — Progress Notes (Signed)

## 2021-06-28 NOTE — Addendum Note (Signed)
Addended by: Perlie Mayo on: 06/28/2021 02:07 PM   Modules accepted: Orders

## 2021-07-20 ENCOUNTER — Telehealth: Payer: BC Managed Care – PPO | Admitting: Physician Assistant

## 2021-07-20 DIAGNOSIS — R6889 Other general symptoms and signs: Secondary | ICD-10-CM

## 2021-07-20 NOTE — Progress Notes (Signed)
I have spent 5 minutes in review of e-visit questionnaire, review and updating patient chart, medical decision making and response to patient.   Marlo Arriola Cody Maeve Debord, PA-C    

## 2021-07-20 NOTE — Progress Notes (Signed)
E visit for Flu like symptoms   We are sorry that you are not feeling well.  Here is how we plan to help! Based on what you have shared with me it looks like you may have a respiratory virus that may be influenza.  Influenza or "the flu" is   an infection caused by a respiratory virus. The flu virus is highly contagious and persons who did not receive their yearly flu vaccination may "catch" the flu from close contact.  We have anti-viral medications to treat the viruses that cause this infection. They are not a "cure" and only shorten the course of the infection. These prescriptions are most effective when they are given within the first 2 days of "flu" symptoms. Antiviral medication are indicated if you have a high risk of complications from the flu. You should  also consider an antiviral medication if you are in close contact with someone who is at risk. These medications can help patients avoid complications from the flu  but have side effects that you should know. Possible side effects from Tamiflu or oseltamivir include nausea, vomiting, diarrhea, dizziness, headaches, eye redness, sleep problems or other respiratory symptoms. You should not take Tamiflu if you have an allergy to oseltamivir or any to the ingredients in Tamiflu.  Please follow recommendations below. Since you are out of the window for antivirals, this will just have to run its course unfortunately. I have sent a work note to Doctor, hospital.   ANYONE WHO HAS FLU SYMPTOMS SHOULD: Stay home. The flu is highly contagious and going out or to work exposes others! Be sure to drink plenty of fluids. Water is fine as well as fruit juices, sodas and electrolyte beverages. You may want to stay away from caffeine or alcohol. If you are nauseated, try taking small sips of liquids. How do you know if you are getting enough fluid? Your urine should be a pale yellow or almost colorless. Get rest. Taking a steamy shower or using a humidifier may  help nasal congestion and ease sore throat pain. Using a saline nasal spray works much the same way. Cough drops, hard candies and sore throat lozenges may ease your cough. Line up a caregiver. Have someone check on you regularly.   GET HELP RIGHT AWAY IF: You cannot keep down liquids or your medications. You become short of breath Your fell like you are going to pass out or loose consciousness. Your symptoms persist after you have completed your treatment plan MAKE SURE YOU  Understand these instructions. Will watch your condition. Will get help right away if you are not doing well or get worse.  Your e-visit answers were reviewed by a board certified advanced clinical practitioner to complete your personal care plan.  Depending on the condition, your plan could have included both over the counter or prescription medications.  If there is a problem please reply  once you have received a response from your provider.  Your safety is important to [place].  If you have drug allergies check your prescription carefully.    You can use MyChart to ask questions about today's visit, request a non-urgent call back, or ask for a work or school excuse for 24 hours related to this e-Visit. If it has been greater than 24 hours you will need to follow up with your provider, or enter a new e-Visit to address those concerns.  You will get an e-mail in the next two days asking about your experience.  I hope that your e-visit has been valuable and will speed your recovery. Thank you for using e-visits.

## 2021-07-26 ENCOUNTER — Ambulatory Visit (INDEPENDENT_AMBULATORY_CARE_PROVIDER_SITE_OTHER): Payer: BC Managed Care – PPO | Admitting: Family

## 2021-07-26 ENCOUNTER — Other Ambulatory Visit: Payer: Self-pay

## 2021-07-26 VITALS — BP 112/80 | HR 88 | Temp 98.3°F | Ht 69.0 in | Wt 235.0 lb

## 2021-07-26 DIAGNOSIS — R051 Acute cough: Secondary | ICD-10-CM | POA: Insufficient documentation

## 2021-07-26 DIAGNOSIS — J40 Bronchitis, not specified as acute or chronic: Secondary | ICD-10-CM | POA: Diagnosis not present

## 2021-07-26 MED ORDER — METHYLPREDNISOLONE 4 MG PO TBPK: ORAL_TABLET | ORAL | 0 refills | Status: DC

## 2021-07-26 MED ORDER — AMOXICILLIN-POT CLAVULANATE 875-125 MG PO TABS: 1.0000 | ORAL_TABLET | Freq: Two times a day (BID) | ORAL | 0 refills | Status: DC

## 2021-07-26 NOTE — Assessment & Plan Note (Signed)
Medrol dose pack prescribed. Pt to start as prescribed. If any increasing sob go to er and or call 911.ok to take otc delsym for cough. Keep head of bed elevated at night to help as well.

## 2021-07-26 NOTE — Progress Notes (Signed)
Subjective:     Patient ID: Sheila Lam, female    DOB: 1965/12/23, 55 y.o.   MRN: [id number]  No chief complaint on file.   HPI Started 11 days ago with mild sore throat, cough, congestion which has been slowly improving until the last few days as symptoms start to worsening again. Describes cough now as horrible cough, hard to sleep coughing so much. Hard to take a deep breath with sob on exertion. Did try to use her albuterol inhaler with no real improvement of symptoms. Did try some nyquil last night which also didn't suppress the cough.   Covid test was negative at onset of symptoms.    Had virtual visit 11/8 and was suspected to have the flu as she had had flu exposure.  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   History of stomach ulcers    Joint pain    Lactose intolerance    Osteoarthritis    Other fatigue    Vitamin D deficiency     Past Surgical History:  Procedure Laterality Date   [place]  1980's   Per patient   OTHER SURGICAL HISTORY  1986   Hernia surgery   PARTIAL HYSTERECTOMY  1996   TOTAL VAGINAL HYSTERECTOMY  1999    Family History  Problem Relation Age of Onset   Anxiety disorder Mother    Cancer Mother    Thyroid disease Mother    Heart disease Mother    Hyperlipidemia Mother    Hypertension Mother    Hypothyroidism Mother    Bladder Cancer Mother    Obesity Father    Cancer Father    Hypertension Father    Hyperlipidemia Father    Prostate cancer Father     Hypothyroidism Maternal Grandmother    Hyperthyroidism Maternal Grandmother    Heart disease Maternal Grandmother    Lupus Maternal Grandfather    Leukemia Paternal Grandmother    Diabetes Paternal Grandmother    Heart disease Paternal Grandmother    Heart disease Other    Anxiety disorder Other    Alcoholism Other    Obesity Other     Social History   Socioeconomic History   Marital status: Married    Spouse name: shannon   Number of children: 1   Years of education: Not on file   Highest education level: Not on file  Occupational History   Not on file  Tobacco Use   Smoking status: Former    Years: 9.00    Types: Cigarettes    Quit date: 09/12/1996    Years since quitting: 24.8   Smokeless tobacco: Never  Vaping Use   Vaping Use: Never used  Substance and Sexual Activity   Alcohol use: Not Currently   Drug use: No   Sexual activity: Yes    Partners: Male  Other Topics Concern  Not on file  Social History Narrative   Not on file   Social Determinants of Health   Financial Resource Strain: Not on file  Food Insecurity: Not on file  Transportation Needs: Not on file  Physical Activity: Not on file  Stress: Not on file  Social Connections: Not on file  Intimate Partner Violence: Not on file    Outpatient Medications Prior to Visit  Medication Sig Dispense Refill   albuterol (VENTOLIN HFA) 108 (90 Base) MCG/ACT inhaler Inhale 2 puffs into the lungs every 6 (six) hours as needed for wheezing or shortness of breath. 18 g 2   Digestive Enzymes CAPS Take 1 capsule by mouth in the morning and at bedtime.     fluticasone (FLONASE) 50 MCG/ACT nasal spray Place 2 sprays into both nostrils daily. 16 g 0   No facility-administered medications prior to visit.    Allergies  Allergen Reactions   Lactose Dermatitis, Diarrhea and Shortness Of Breath   Cephalexin Diarrhea and Nausea And Vomiting   Doxycycline Diarrhea and Nausea And Vomiting   Azithromycin     Causes  increased congestion, can not tolerate.   Codeine Nausea And Vomiting   Gluten Meal     Celiac disease   Soy Allergy     Breast swelling    Review of Systems  Constitutional:  Negative for chills and fever.  HENT:  Positive for congestion, ear pain (bilateral ear pain) and sore throat (mild but improving). Negative for sinus pain.   Respiratory:  Positive for cough (wet, productive at times with green sputum, worse with lying down) and shortness of breath (with exertion and occasionally at rest. no improvement with albuterol inhaler.). Negative for wheezing.   Cardiovascular:  Negative for chest pain.      Objective:    Physical Exam Constitutional:      Appearance: Normal appearance.  HENT:     Right Ear: Tympanic membrane normal.     Left Ear: Tympanic membrane normal.     Nose: Nose normal.     Right Turbinates: Swollen.     Left Turbinates: Swollen.     Right Sinus: No maxillary sinus tenderness or frontal sinus tenderness.     Mouth/Throat:     Mouth: Mucous membranes are moist. No oral lesions.     Pharynx: No pharyngeal swelling or posterior oropharyngeal erythema.     Tonsils: No tonsillar exudate.     Comments: [place] of throat Cardiovascular:     Rate and Rhythm: Normal rate.  Pulmonary:     Effort: Pulmonary effort is normal.     Breath sounds: Normal breath sounds.    BP 112/80   Pulse 88   Temp 98.3 F (36.8 C)   Ht 5' 9"  (1.753 m)   Wt 235 lb (106.6 kg)   SpO2 95%   BMI 34.70 kg/m  Wt Readings from Last 3 Encounters:  07/26/21 235 lb (106.6 kg)  04/06/21 229 lb (103.9 kg)  01/26/21 217 lb 8 oz (98.7 kg)       Assessment & Plan:   Problem List Items Addressed This Visit       Respiratory   Bronchitis - Primary    Advised to start antibiotic as prescribed and continue for duration of treatment increase oral hydration. Ok to take mucinex for congestion. If sx worsen or fail to improve instructed to f/u sooner and we will consider cxr. If  worsening sob go to er and or call 911.  Other   Acute cough    Medrol dose pack prescribed. Pt to start as prescribed. If any increasing sob go to er and or call 911.ok to take otc delsym for cough. Keep head of bed elevated at night to help as well.        I am having Sheila Lam start on methylPREDNISolone and amoxicillin-clavulanate. I am also having her maintain her albuterol, Digestive Enzymes, and fluticasone.  Meds ordered this encounter  Medications   methylPREDNISolone (MEDROL DOSEPAK) 4 MG TBPK tablet    Sig: Take per package instructions    Dispense:  21 tablet    Refill:  0   amoxicillin-clavulanate (AUGMENTIN) 875-125 MG tablet    Sig: Take 1 tablet by mouth 2 (two) times daily.    Dispense:  20 tablet    Refill:  0

## 2021-07-26 NOTE — Patient Instructions (Signed)
Start antibiotic augmentin and medrol dose pack as prescribed. Ok to take otc mucinex for sputum as well as delsym for cough. Increase oral hydration as well to thin secretions.   If any increasing shortness of breath go to ER and or all 911.

## 2021-07-26 NOTE — Assessment & Plan Note (Signed)
Advised to start antibiotic as prescribed and continue for duration of treatment increase oral hydration. Ok to take mucinex for congestion. If sx worsen or fail to improve instructed to f/u sooner and we will consider cxr. If worsening sob go to er and or call 911.

## 2021-07-28 ENCOUNTER — Encounter: Payer: Self-pay | Admitting: Family Medicine

## 2021-07-28 ENCOUNTER — Telehealth: Payer: BC Managed Care – PPO | Admitting: Family Medicine

## 2021-07-28 ENCOUNTER — Other Ambulatory Visit: Payer: Self-pay

## 2021-07-28 ENCOUNTER — Telehealth (INDEPENDENT_AMBULATORY_CARE_PROVIDER_SITE_OTHER): Payer: BC Managed Care – PPO | Admitting: Family Medicine

## 2021-07-28 DIAGNOSIS — J209 Acute bronchitis, unspecified: Secondary | ICD-10-CM

## 2021-07-28 MED ORDER — BENZONATATE 200 MG PO CAPS
200.0000 mg | ORAL_CAPSULE | Freq: Two times a day (BID) | ORAL | 0 refills | Status: DC | PRN
Start: 2021-07-28 — End: 2021-10-13

## 2021-07-28 MED ORDER — PROMETHAZINE HCL 6.25 MG/5ML PO SYRP: 6.2500 mg | ORAL_SOLUTION | Freq: Four times a day (QID) | ORAL | 0 refills | Status: DC | PRN

## 2021-07-28 NOTE — Progress Notes (Signed)
Chief Complaint  Patient presents with   Cough    [facility] here for URI complaints. Due to COVID-19 pandemic, we are interacting via web portal for an electronic face-to-face visit. I verified patient's ID using 2 identifiers. Patient agreed to proceed with visit via this method. Patient is at home, I am at office. Patient and I are present for visit.   Duration: 2 weeks  Associated symptoms: sinus congestion, rhinorrhea, shortness of breath, and coughing Denies: sinus pain, itchy watery eyes, ear pain, ear drainage, sore throat, myalgia, and fevers, N/V Treatment to date: Augmentin, Medrol Dosepak Sick contacts: None known; illness going on at school Tested neg for covid x 3.   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 Phenergan syrup to coat throat and help sleep, Tessalon Perles prn.  Continue to push fluids, practice good hand hygiene, cover mouth when coughing. F/u prn. If starting to experience irreplaceable fluid, shaking, or shortness of breath, seek immediate care. Pt voiced understanding and agreement to the plan.  [place], DO 07/28/21 3:48 PM

## 2021-08-18 ENCOUNTER — Encounter: Payer: Self-pay | Admitting: Family Medicine

## 2021-08-25 ENCOUNTER — Ambulatory Visit: Payer: BC Managed Care – PPO | Admitting: Family Medicine

## 2021-08-25 ENCOUNTER — Encounter: Payer: Self-pay | Admitting: Family Medicine

## 2021-08-25 VITALS — BP 120/82 | HR 63 | Temp 97.8°F | Ht 69.0 in | Wt 236.0 lb

## 2021-08-25 DIAGNOSIS — B070 Plantar wart: Secondary | ICD-10-CM

## 2021-08-25 NOTE — Progress Notes (Signed)
Chief Complaint  Patient presents with   Foot Problem    Painful when walking    Sheila Lam is a 55 y.o. female here for a skin complaint.  Duration: 2 weeks Location: Bottom of R foot Pruritic? No Painful? Yes Drainage? No New soaps/lotions/topicals/detergents? 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 +TTP.  No drainage, erythema, fluctuance, excoriation Psych: Age appropriate judgment and insight   Plantar surface of right foot with lesion over the lateral edge  Plantar wart of right foot  Recommended crushing up some aspirin and adding water to make a paste.  Apply for 10 to 15 minutes daily for the next 2 to 3 weeks.  If this does not improve symptoms significantly or fully resolve issue, she will let me know. F/u prn. The patient voiced understanding and agreement to the plan.  [place], DO 08/25/21 3:17 PM

## 2021-08-25 NOTE — Patient Instructions (Signed)
Take some aspirin, grind it up and add some water to make a paste. Apply to your foot daily for 10-15 min. This should be fixed in 2-3 weeks.   Let us know if you need anything.

## 2021-10-13 ENCOUNTER — Ambulatory Visit: Payer: BC Managed Care – PPO | Admitting: Family Medicine

## 2021-10-13 ENCOUNTER — Encounter: Payer: Self-pay | Admitting: Family Medicine

## 2021-10-13 VITALS — BP 132/80 | HR 75 | Temp 97.6°F | Ht 69.0 in | Wt 235.0 lb

## 2021-10-13 DIAGNOSIS — R109 Unspecified abdominal pain: Secondary | ICD-10-CM

## 2021-10-13 NOTE — Progress Notes (Signed)
Chief Complaint  Patient presents with   bruising on abdomen    Subjective: Patient is a 56 y.o. female here for bruising.  A little over a month ago, the patient started doing more core exercises.  During 1 episode of using her ab roller, she felt some tearing in her abdominal region and had scarring and pain following that.  Symptoms resolved and it happened again.  She is currently having pain in the area in addition to bruising again.  She denies any trauma.  She has a history of several abdominal surgeries, most recently 29 years ago she had a screw placed in scar tissue due to there being so much and it was interfering with the surgical field.  The titanium screw was left in place, though she is not exactly sure where it is.  She does not feel any screw over her abdominal region.  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  I do not appreciate any deformity or anything that feels like a screw.  Negative Carnett's sign. Lungs: No accessory muscle use Psych: Age appropriate judgment and insight, normal affect and mood  Assessment and Plan: Abdominal wall pain  Ice, heat, Tylenol, ibuprofen.  I will reach out to the sports medicine team to see if this is something they can image while in the office.  If not, we will  reach out to the radiology team to see what they think.  I suspect she is having small tears of her rectus muscles. The patient voiced understanding and agreement to the plan.  [place], DO 10/13/21  4:29 PM

## 2021-10-13 NOTE — Patient Instructions (Signed)
Give me some time to reach out to a specialist to see if he can take care of this or if we need to just order imaging.   Ice/cold pack over area for 10-15 min twice daily.  OK to take Tylenol 1000 mg (2 extra strength tabs) or 975 mg (3 regular strength tabs) every 6 hours as needed.  Heat (pad or rice pillow in microwave) over affected area, 10-15 minutes twice daily.   Let us know if you need anything.

## 2021-10-13 NOTE — Addendum Note (Signed)
Addended by: Ames Coupe on: 10/13/2021 05:04 PM   Modules accepted: Orders

## 2021-10-21 ENCOUNTER — Ambulatory Visit: Payer: BC Managed Care – PPO | Admitting: Family Medicine

## 2021-10-21 ENCOUNTER — Encounter: Payer: Self-pay | Admitting: Family Medicine

## 2021-10-21 ENCOUNTER — Ambulatory Visit: Payer: Self-pay

## 2021-10-21 VITALS — BP 120/82 | Ht 69.0 in | Wt 235.0 lb

## 2021-10-21 DIAGNOSIS — R1031 Right lower quadrant pain: Secondary | ICD-10-CM

## 2021-10-21 DIAGNOSIS — S3981XA Other specified injuries of abdomen, initial encounter: Secondary | ICD-10-CM

## 2021-10-21 NOTE — Assessment & Plan Note (Signed)
Acutely occurring.  Has been ongoing for about 4 weeks.  She has a history of 4 surgeries on the abdominal wall.  The right-sided musculature is much less intense compared to contralateral side.  Concern for hernia given the area of tenderness in the changes appreciated on ultrasound. -Counseled on home exercise therapy and supportive care. -Could consider further imaging.

## 2021-10-21 NOTE — Progress Notes (Signed)
°  Sheila Lam - 56 y.o. female MRN [id number]  Date of birth: 03-16-1966  SUBJECTIVE:  Including CC & ROS.  No chief complaint on file.   Sheila Lam is a 56 y.o. female that is presenting with right lower quadrant abdominal pain.  She has had ecchymosis and tenderness to this area.  Has a history of C-section as well as hysterectomy and surgery for endometriosis.  She noticed to be pain after doing a ab roller.  Independent review of the pelvis and right hip x-ray from 2021 shows no acute changes.   Review of Systems See HPI   HISTORY: Past Medical, Surgical, Social, and Family History Reviewed & Updated per EMR.   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   History of stomach ulcers    Joint pain    Lactose intolerance    Osteoarthritis    Other fatigue    Vitamin D deficiency     Past Surgical History:  Procedure Laterality Date   CESAREAN SECTION  1998   HERNIA REPAIR  1980's   Per patient   OTHER SURGICAL HISTORY  1986   Hernia surgery   PARTIAL HYSTERECTOMY  1996   TOTAL VAGINAL HYSTERECTOMY  1999     PHYSICAL EXAM:  VS: BP 120/82 (BP Location: Left Arm, Patient Position: Sitting)    Ht 5' 9"  (1.753 m)    Wt 235 lb (106.6 kg)    BMI 34.70 kg/m  Physical Exam Gen: NAD, alert, cooperative with exam, well-appearing MSK:  Neurovascularly intact    Limited ultrasound: Abdomen:  The rectus abdominis measured on the right is much thinner in diameter when compared to the left. There is increased hyperemia in the subcutaneous overlying the abductor longus. There is no changes of the right hip joint or the AIIS.  Summary: Concern for  sports hernia  Ultrasound and interpretation by Clearance Coots, MD    ASSESSMENT & PLAN:   Sports hernia Acutely occurring.  Has been ongoing for about 4 weeks.  She has a history of 4 surgeries on the abdominal wall.  The right-sided musculature is much less intense compared to contralateral side.  Concern for hernia given the area of tenderness in the changes appreciated on ultrasound. -Counseled on home exercise therapy and supportive care. -Could consider further imaging.

## 2021-10-21 NOTE — Patient Instructions (Signed)
Nice to meet you Please use heat as needed  Please work on the adductors  Try to take a break from the ab roller   Please send me a message in [place] with any questions or updates.  Please see me back in 3-4 weeks.   --Dr. Raeford Razor

## 2021-10-27 ENCOUNTER — Other Ambulatory Visit: Payer: Self-pay | Admitting: Family Medicine

## 2021-10-27 ENCOUNTER — Encounter: Payer: Self-pay | Admitting: Family Medicine

## 2021-10-27 DIAGNOSIS — Z09 Encounter for follow-up examination after completed treatment for conditions other than malignant neoplasm: Secondary | ICD-10-CM

## 2021-11-08 ENCOUNTER — Telehealth: Payer: BC Managed Care – PPO | Admitting: Nurse Practitioner

## 2021-11-08 DIAGNOSIS — J0140 Acute pansinusitis, unspecified: Secondary | ICD-10-CM

## 2021-11-08 MED ORDER — AMOXICILLIN-POT CLAVULANATE 875-125 MG PO TABS: 1.0000 | ORAL_TABLET | Freq: Two times a day (BID) | ORAL | 0 refills | Status: AC

## 2021-11-08 NOTE — Progress Notes (Signed)
E-Visit for Sinus Problems  We are sorry that you are not feeling well.  Here is how we plan to help!  Based on what you have shared with me it looks like you have sinusitis.  Sinusitis is inflammation and infection in the sinus cavities of the head.  Based on your presentation I believe you most likely have Acute Bacterial Sinusitis.  This is an infection caused by bacteria and is treated with antibiotics. I have prescribed Augmentin 845m/125mg one tablet twice daily with food, for 7 days. You may use an oral decongestant such as Mucinex D or if you have glaucoma or high blood pressure use plain Mucinex. Saline nasal spray help and can safely be used as often as needed for congestion.  If you develop worsening sinus pain, fever or notice severe headache and vision changes, or if symptoms are not better after completion of antibiotic, please schedule an appointment with a health care provider.    Sinus infections are not as easily transmitted as other respiratory infection, however we still recommend that you avoid close contact with loved ones, especially the very young and elderly.  Remember to wash your hands thoroughly throughout the day as this is the number one way to prevent the spread of infection!  Home Care: Only take medications as instructed by your medical team. Complete the entire course of an antibiotic. Do not take these medications with alcohol. A steam or ultrasonic humidifier can help congestion.  You can place a towel over your head and breathe in the steam from hot water coming from a faucet. Avoid close contacts especially the very young and the elderly. Cover your mouth when you cough or sneeze. Always remember to wash your hands.  Get Help Right Away If: You develop worsening fever or sinus pain. You develop a severe head ache or visual changes. Your symptoms persist after you have completed your treatment plan.  Make sure you Understand these instructions. Will watch  your condition. Will get help right away if you are not doing well or get worse.  Thank you for choosing an e-visit.  Your e-visit answers were reviewed by a board certified advanced clinical practitioner to complete your personal care plan. Depending upon the condition, your plan could have included both over the counter or prescription medications.  Please review your pharmacy choice. Make sure the pharmacy is open so you can pick up prescription now. If there is a problem, you may contact your provider through [employer] have the prescription routed to another pharmacy.  Your safety is important to [place] If you have drug allergies check your prescription carefully.   For the next 24 hours you can use MyChart to ask questions about today's visit, request a non-urgent call back, or ask for a work or school excuse. You will get an email in the next two days asking about your experience. I hope that your e-visit has been valuable and will speed your recovery.   I spent approximately 7 minutes reviewing the patient's history, current symptoms and coordinating their plan of care today.    Meds ordered this encounter  Medications   amoxicillin-clavulanate (AUGMENTIN) 875-125 MG tablet    Sig: Take 1 tablet by mouth 2 (two) times daily for 7 days.    Dispense:  14 tablet    Refill:  0

## 2021-11-22 ENCOUNTER — Ambulatory Visit: Payer: BC Managed Care – PPO | Admitting: Family Medicine

## 2021-11-24 ENCOUNTER — Telehealth: Payer: BC Managed Care – PPO | Admitting: Physician Assistant

## 2021-11-24 ENCOUNTER — Encounter: Payer: Self-pay | Admitting: Family Medicine

## 2021-11-24 ENCOUNTER — Telehealth (INDEPENDENT_AMBULATORY_CARE_PROVIDER_SITE_OTHER): Payer: BC Managed Care – PPO | Admitting: Family Medicine

## 2021-11-24 DIAGNOSIS — J329 Chronic sinusitis, unspecified: Secondary | ICD-10-CM

## 2021-11-24 DIAGNOSIS — J0100 Acute maxillary sinusitis, unspecified: Secondary | ICD-10-CM

## 2021-11-24 MED ORDER — AMOXICILLIN-POT CLAVULANATE 875-125 MG PO TABS: 1.0000 | ORAL_TABLET | Freq: Two times a day (BID) | ORAL | 0 refills | Status: DC

## 2021-11-24 NOTE — Progress Notes (Signed)
Chief Complaint  ?Patient presents with  ? Sinus Problem  ? ? ?Sheila Lam here for URI complaints. Due to COVID-19 pandemic, we are interacting via web portal for an electronic face-to-face visit. I verified patient's ID using 2 identifiers. Patient agreed to proceed with visit via this method. Patient is at work, I am at office. Patient and I are present for visit.  ? ?Duration: a few weeks  ?Associated symptoms: sinus congestion, sinus pain, rhinorrhea, and some coughing, dental pain in upper row ?Denies: itchy watery eyes, ear pain, ear drainage, sore throat, wheezing, shortness of breath, myalgia, and fevers ?Treatment to date: Nasacort, Sudafed, Mucinex ?Sick contacts: No ? ?Past Medical History:  ?Diagnosis Date  ? Anemia   ? Anxiety   ? Arthritis   ? Back pain   ? Bilateral swelling of feet and ankles   ? Celiac disease   ? Chronic bronchitis ([place])   ? Chronic fatigue syndrome   ? Constipation   ? Depression   ? Fibromyalgia   ? 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 History of stomach ulcers   ? Joint pain   ? Lactose intolerance   ? Osteoarthritis   ? Other fatigue   ? Vitamin D deficiency   ? ? ?Objective ?No conversational dyspnea ?Age appropriate judgment and insight ?Nml affect and mood ? ?Acute maxillary sinusitis, recurrence not specified - Plan: amoxicillin-clavulanate (AUGMENTIN) 875-125 MG tablet ? ?10 d of Augmentin. Cont INCS. Tylenol prn.  ?Continue to push fluids, practice good hand hygiene, cover mouth when coughing. ?F/u prn. If starting to experience fevers, shaking, or shortness of breath, seek immediate care. ?Pt voiced understanding and agreement to the plan. ? ?Shelda Pal, DO ?11/24/21 ?11:59 AM ? ?

## 2021-11-24 NOTE — Progress Notes (Signed)
Based on what you shared with me, I feel your condition warrants further evaluation and I recommend that you be seen in a face to face visit. ? ?I feel that you need to be seen in person for your symptoms. You have already been appropriately treated with a course of antibiotics and given that your symptoms have not improved I think that you need an in person evaluation so that you can have a physical exam and make sure that nothing more complex is contributing to your symptoms. ?  ?NOTE: There will be NO CHARGE for this eVisit ?  ?If you are having a true medical emergency please call 911.   ?  ? For an urgent face to face visit, [facility] has six urgent care centers for your convenience:  ?  ? [facility] ?Get Driving Directions ?[phone] ?[address] ?[place], [place] [address] ?  ? [facility]) ?Get Driving Directions ?[phone] ?[address] ?[place], [place] [address] ? ?[facility] [place] [facility] ?Get Driving Directions ?[address],  [place]  [address] ? ?[facility] ?Get Driving Directions ?[phone] ?[address], [address] ?[place], [place] [address] ?  ?[facility] [place] ?Get Driving Directions  ?[phone] ?[address] ?[address] ?[place], [place] [address] ?  ?[facility] ?Get Driving Directions ?[phone] ?[address] ?[place], [place] [address] ? ?Your MyChart E-visit questionnaire answers were reviewed by a board certified advanced clinical practitioner to complete your personal care plan based on your specific symptoms.  Thank you for using e-Visits. ?  ?Approximately 5 minutes was spent documenting and reviewing patient's chart. ? ? ?

## 2021-11-27 IMAGING — CR DG CHEST 2V
2 series · 2 of 2 positions shown · non-contrast
Comparison: Chest x-ray dated September 11, 2017.

CLINICAL DATA: Intermittent shortness of breath for the past 6
months.

EXAM:
CHEST - 2 VIEW

[chest pa]
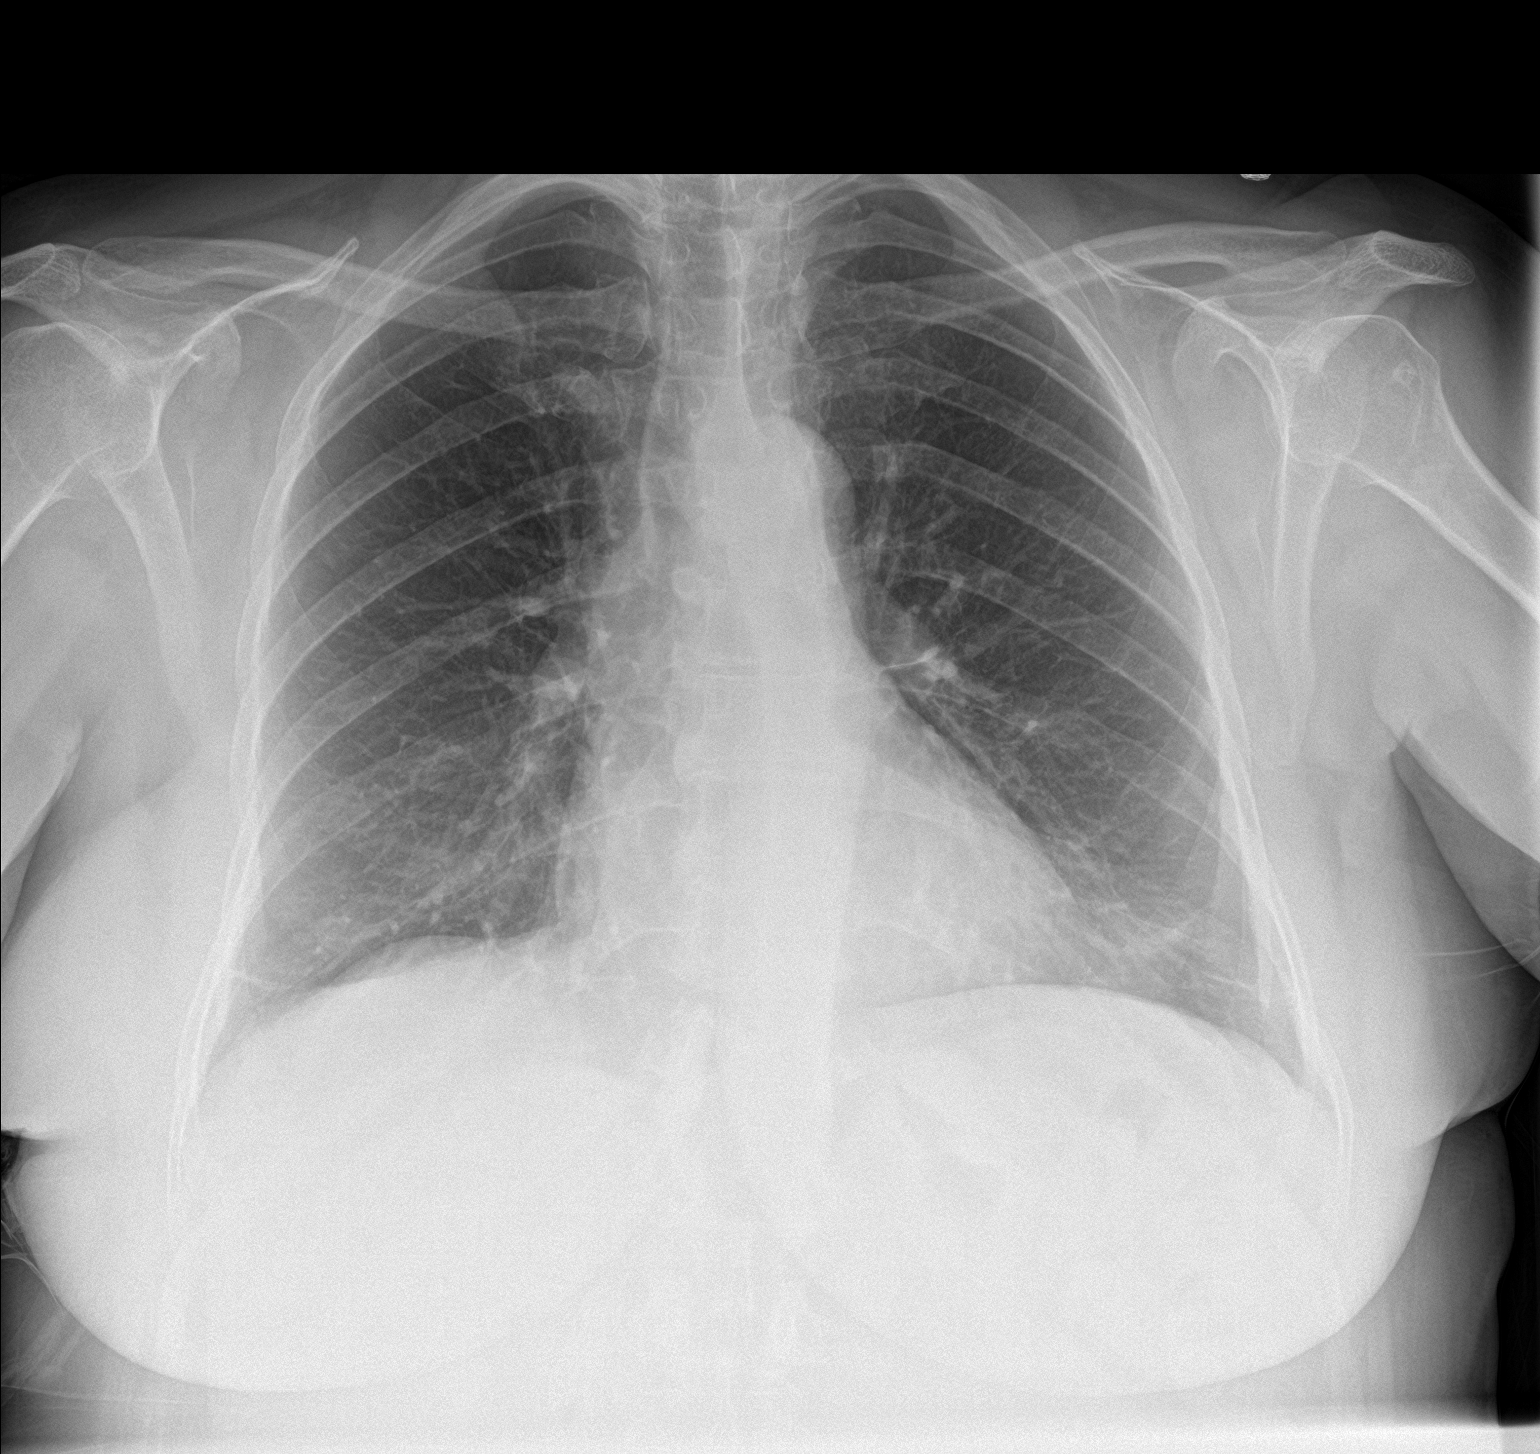

[chest lat]
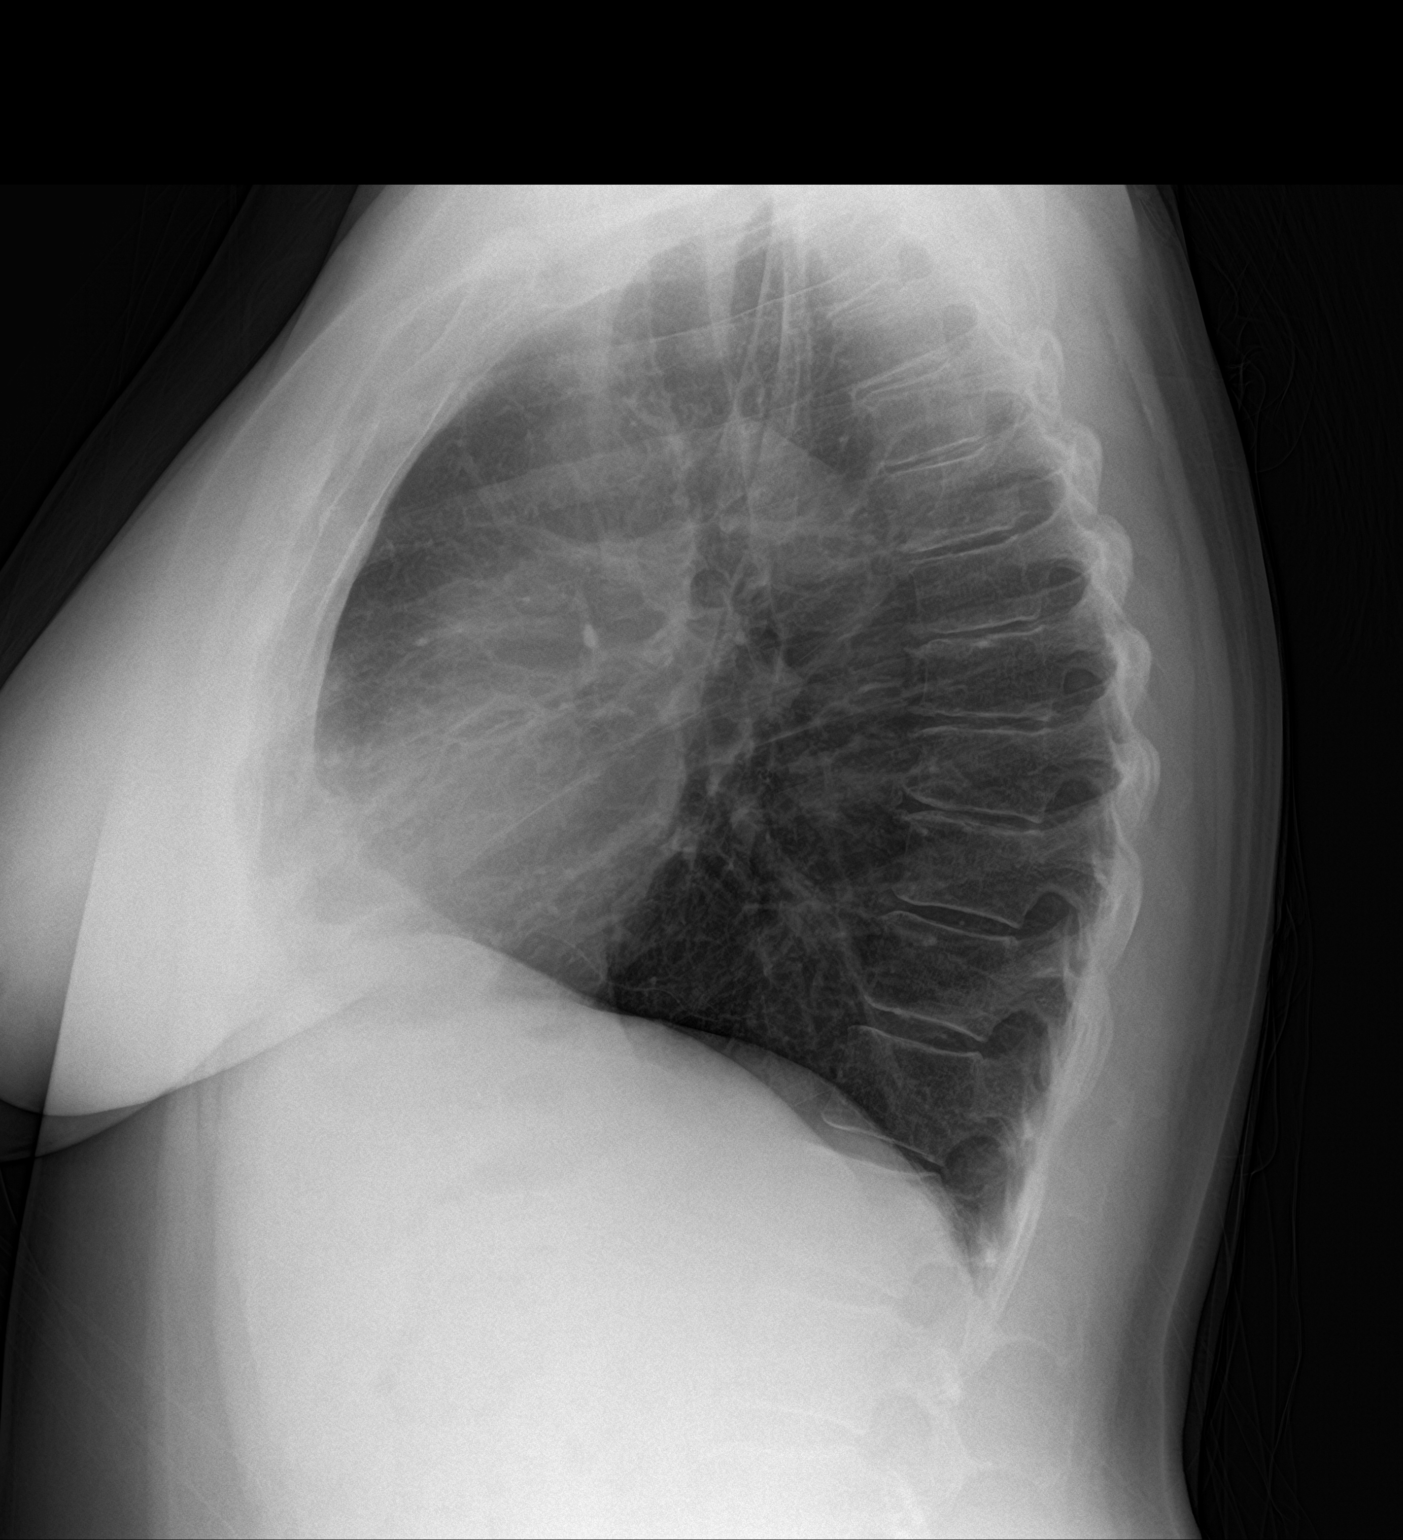

[2 of 2 positions shown; findings below may reference images not displayed]

FINDINGS: The heart size and mediastinal contours are within normal limits.
Normal pulmonary vascularity. Mild chronic scarring at the left lung
base. Minimal linear atelectasis at the peripheral right lung base.
No focal consolidation, pleural effusion, or pneumothorax. No acute
osseous abnormality.
IMPRESSION: No active cardiopulmonary disease.

## 2022-01-05 ENCOUNTER — Encounter: Payer: Self-pay | Admitting: Family Medicine

## 2022-01-08 ENCOUNTER — Encounter: Payer: Self-pay | Admitting: Family Medicine

## 2022-01-10 ENCOUNTER — Telehealth (INDEPENDENT_AMBULATORY_CARE_PROVIDER_SITE_OTHER): Payer: BC Managed Care – PPO | Admitting: Family Medicine

## 2022-01-10 ENCOUNTER — Encounter: Payer: Self-pay | Admitting: Family Medicine

## 2022-01-10 DIAGNOSIS — K635 Polyp of colon: Secondary | ICD-10-CM | POA: Diagnosis not present

## 2022-01-10 DIAGNOSIS — N644 Mastodynia: Secondary | ICD-10-CM | POA: Diagnosis not present

## 2022-01-10 NOTE — Progress Notes (Signed)
Chief Complaint  ?Patient presents with  ? referral to Endo  ? ? ?Subjective: ?Patient is a 56 y.o. female here for large breasts. Due to COVID-19 pandemic, we are interacting via web portal for an electronic face-to-face visit. I verified patient's ID using 2 identifiers. Patient agreed to proceed with visit via this method. Patient is at work, I am at office. Patient and I are present for visit.  ? ?Patient has a history of large breasts.  She has a family history of this and most of her females of the family.  She is working to get Biochemist, clinical for breast reduction.  She had an unremarkable work-up at her previous doctor.  Surgical oncologist evaluated her and recommended seeing an endocrinologist.  She never knew that information due to it being a poor experience for her and her previous PCP retiring shortly after.  She continues to have bouts of bilateral breast pain.  She is interested in seeing an endocrinologist.   ? ?Past Medical History:  ?Diagnosis Date  ? Anemia   ? Anxiety   ? Arthritis   ? Back pain   ? Bilateral swelling of feet and ankles   ? Celiac disease   ? Chronic bronchitis ([place])   ? Chronic fatigue syndrome   ? Constipation   ? Depression   ? Fibromyalgia   ? 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 History of stomach ulcers   ? Joint pain   ? Lactose intolerance   ? Osteoarthritis   ? Other fatigue   ? Vitamin D deficiency   ? ? ?Objective: ?No conversational dyspnea ?Age appropriate judgment and insight ?Nml affect and mood ? ?Assessment and Plan: ?Diffuse non-cyclical breast pain - Plan: Ambulatory referral to Endocrinology ? ?Polyp of colon, unspecified part of colon, unspecified type - Plan: Ambulatory referral to Gastroenterology ? ?Refer Endo for opinion to see if breast pain is related to hormones as previous specialist was concerned about.  ?Patient has a history of a colon polyp meant to be monitored every 6 months.  She did not receive a call  from her previous GI doctor and would like to be set up with someone thru [facility].  ?The patient voiced understanding and agreement to the plan. ? ?Shelda Pal, DO ?01/10/22  ?4:34 PM ? ? ? ? ?

## 2022-01-27 ENCOUNTER — Encounter: Payer: Self-pay | Admitting: Family Medicine

## 2022-01-28 ENCOUNTER — Other Ambulatory Visit: Payer: Self-pay | Admitting: Family Medicine

## 2022-01-28 DIAGNOSIS — K635 Polyp of colon: Secondary | ICD-10-CM

## 2022-01-28 DIAGNOSIS — N644 Mastodynia: Secondary | ICD-10-CM

## 2022-01-28 DIAGNOSIS — K900 Celiac disease: Secondary | ICD-10-CM

## 2022-02-10 ENCOUNTER — Ambulatory Visit: Payer: BC Managed Care – PPO | Admitting: Family Medicine

## 2022-02-18 ENCOUNTER — Telehealth: Payer: Self-pay | Admitting: Gastroenterology

## 2022-02-18 NOTE — Telephone Encounter (Signed)
Good Morning Dr. Tarri Glenn,   We have received records to be reviewed for patient from [facility]. Patient is requesting to establish care with you and also have a colonoscopy. Records from [facility] shows that patient had her last colonoscopy in 2018. We received both the report and path report as well. I will be sending records for review. Will you please review and advise on scheduling?  Thank you.

## 2022-03-16 ENCOUNTER — Encounter: Payer: Self-pay | Admitting: Family Medicine

## 2022-03-16 ENCOUNTER — Telehealth: Payer: Self-pay | Admitting: Family Medicine

## 2022-03-16 ENCOUNTER — Telehealth (INDEPENDENT_AMBULATORY_CARE_PROVIDER_SITE_OTHER): Payer: BC Managed Care – PPO | Admitting: Family Medicine

## 2022-03-16 DIAGNOSIS — E669 Obesity, unspecified: Secondary | ICD-10-CM

## 2022-03-16 MED ORDER — SEMAGLUTIDE-WEIGHT MANAGEMENT 0.5 MG/0.5ML ~~LOC~~ SOAJ: 0.5000 mg | SUBCUTANEOUS | 0 refills | Status: DC

## 2022-03-16 MED ORDER — SEMAGLUTIDE-WEIGHT MANAGEMENT 2.4 MG/0.75ML ~~LOC~~ SOAJ: 2.4000 mg | SUBCUTANEOUS | 0 refills | Status: DC

## 2022-03-16 MED ORDER — SEMAGLUTIDE-WEIGHT MANAGEMENT 1.7 MG/0.75ML ~~LOC~~ SOAJ: 1.7000 mg | SUBCUTANEOUS | 0 refills | Status: DC

## 2022-03-16 MED ORDER — SEMAGLUTIDE-WEIGHT MANAGEMENT 1 MG/0.5ML ~~LOC~~ SOAJ: 1.0000 mg | SUBCUTANEOUS | 0 refills | Status: DC

## 2022-03-16 MED ORDER — SEMAGLUTIDE-WEIGHT MANAGEMENT 0.25 MG/0.5ML ~~LOC~~ SOAJ: 0.2500 mg | SUBCUTANEOUS | 0 refills | Status: DC

## 2022-03-16 NOTE — Progress Notes (Signed)
Chief Complaint  Patient presents with   Follow-up    Letter for weight loss    Subjective: Patient is a 56 y.o. female here for weight issues. Due to COVID-19 pandemic, we are interacting via web portal for an electronic face-to-face visit. I verified patient's ID using 2 identifiers. Patient agreed to proceed with visit via this method. Patient is at home, I am at office. Patient and I are present for visit.   Patient recently had a breast reduction 3 weeks ago.  She feels much better with her neck and back pain.  She has taken Neurontin and had intermittent injections and steroid burst sent in for this.  She is struggled with weight loss for many years.  She works out 4-5 times per week and limits her calories eating a strict gluten-free diet.  She is requesting a letter to reflect some of her progress with my recommendations in addition to lack of reduction of breast size with weight loss.  She had lost 42 pounds at 1 point and her bra size remained triple D.  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  Start Wegovy at 0.25 mg weekly for 4 doses and then steadily  increased until up at 2.4 mg weekly.  She will stay at the highest tolerated dose.  Counseled on diet and exercise.  She is not active right now due to recovering from surgery.  I will see her in 4 weeks to recheck. The patient voiced understanding and agreement to the plan.  [place], DO 03/16/22  12:36 PM

## 2022-03-16 NOTE — Telephone Encounter (Signed)
Initiated PA for [facility] KEY:  [id number] Waiting response from covermy meds

## 2022-03-17 ENCOUNTER — Encounter: Payer: Self-pay | Admitting: Family Medicine

## 2022-03-18 NOTE — Telephone Encounter (Signed)
Received approval from 03/16/2022 to 10/17/2022 Patient/pharmacy aware.

## 2022-03-24 ENCOUNTER — Encounter: Payer: Self-pay | Admitting: Family Medicine

## 2022-03-25 ENCOUNTER — Ambulatory Visit: Payer: BC Managed Care – PPO | Admitting: Family Medicine

## 2022-03-25 ENCOUNTER — Other Ambulatory Visit: Payer: Self-pay | Admitting: Family Medicine

## 2022-03-25 MED ORDER — PHENTERMINE HCL 37.5 MG PO CAPS: 37.5000 mg | ORAL_CAPSULE | ORAL | 0 refills | Status: DC

## 2022-04-05 ENCOUNTER — Encounter: Payer: Self-pay | Admitting: Family Medicine

## 2022-04-05 NOTE — Telephone Encounter (Signed)
error 

## 2022-04-05 NOTE — Telephone Encounter (Signed)
Pt called stating that she needed the following documents from Dr. Nani Ravens:  -Office notes stating that she had been put on Wegovy and Phentermine to facilitate weight loss and the progress involved   -Report showing weight over time  Documents need to be faxed to the previous fax number provided by pt.

## 2022-04-20 ENCOUNTER — Encounter (INDEPENDENT_AMBULATORY_CARE_PROVIDER_SITE_OTHER): Payer: Self-pay

## 2022-05-10 ENCOUNTER — Telehealth (INDEPENDENT_AMBULATORY_CARE_PROVIDER_SITE_OTHER): Payer: BC Managed Care – PPO | Admitting: Family Medicine

## 2022-05-10 ENCOUNTER — Encounter: Payer: Self-pay | Admitting: Family Medicine

## 2022-05-10 DIAGNOSIS — E669 Obesity, unspecified: Secondary | ICD-10-CM | POA: Diagnosis not present

## 2022-05-10 MED ORDER — PHENTERMINE HCL 37.5 MG PO CAPS
37.5000 mg | ORAL_CAPSULE | ORAL | 0 refills | Status: DC
Start: 2022-05-10 — End: 2022-06-09

## 2022-05-10 MED ORDER — PHENTERMINE HCL 37.5 MG PO CAPS: 37.5000 mg | ORAL_CAPSULE | ORAL | 0 refills | Status: DC

## 2022-05-10 NOTE — Progress Notes (Signed)
Chief Complaint  Patient presents with   Follow-up    Discuss weight loss medication.     Subjective: Patient is a 56 y.o. female here for f/u. Due to COVID-19 pandemic, we are interacting via web portal for an electronic face-to-face visit. I verified patient's ID using 2 identifiers. Patient agreed to proceed with visit via this method. Patient is at work, I am at office. Patient and I are present for visit.   Started on phentermine 37.5 mg/d. Reports compliance, no AE's.  Her diet continues to be healthy.  She has been doing strength training in addition to walking daily.  She feels she has lost weight as she feels the fit of her close has improved.  She has not weighed herself just yet.    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  Hopefully we will be able to transition to [facility] at that time.  Counseled on diet and exercise. The patient voiced understanding and agreement to the plan.  [place], DO 05/10/22  4:02 PM

## 2022-06-09 ENCOUNTER — Encounter: Payer: Self-pay | Admitting: Family Medicine

## 2022-06-09 ENCOUNTER — Telehealth (INDEPENDENT_AMBULATORY_CARE_PROVIDER_SITE_OTHER): Payer: BC Managed Care – PPO | Admitting: Family Medicine

## 2022-06-09 DIAGNOSIS — J0140 Acute pansinusitis, unspecified: Secondary | ICD-10-CM | POA: Diagnosis not present

## 2022-06-09 MED ORDER — FLUTICASONE PROPIONATE 50 MCG/ACT NA SUSP: 2.0000 | Freq: Every day | NASAL | 6 refills | Status: AC

## 2022-06-09 MED ORDER — AMOXICILLIN-POT CLAVULANATE 875-125 MG PO TABS: 1.0000 | ORAL_TABLET | Freq: Two times a day (BID) | ORAL | 0 refills | Status: DC

## 2022-06-09 MED ORDER — LEVALBUTEROL TARTRATE 45 MCG/ACT IN AERO: 1.0000 | INHALATION_SPRAY | Freq: Four times a day (QID) | RESPIRATORY_TRACT | 2 refills | Status: DC | PRN

## 2022-06-09 NOTE — Progress Notes (Signed)
Chief Complaint  Patient presents with   Follow-up    To discuss phentermine use due to ingredients    Nasal Congestion    Complains on nasal congestion and drainage    Lyman Bishop here for URI complaints. Due to COVID-19 pandemic, we are interacting via web portal for an electronic face-to-face visit. I verified patient's ID using 2 identifiers. Patient agreed to proceed with visit via this method. Patient is at work, I am at office. Patient and I are present for visit.   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 INCS. Continue to push fluids, practice good hand hygiene, cover mouth when coughing. F/u prn. If starting to experience fevers, shaking, or shortness of breath, seek immediate care. Pt voiced understanding and agreement to the plan.  [place], DO 06/09/22 10:30 AM

## 2022-07-14 ENCOUNTER — Other Ambulatory Visit: Payer: Self-pay | Admitting: Family Medicine

## 2022-07-14 ENCOUNTER — Encounter: Payer: Self-pay | Admitting: Family Medicine

## 2022-07-14 DIAGNOSIS — J0140 Acute pansinusitis, unspecified: Secondary | ICD-10-CM

## 2022-07-14 MED ORDER — AMOXICILLIN-POT CLAVULANATE 875-125 MG PO TABS: 1.0000 | ORAL_TABLET | Freq: Two times a day (BID) | ORAL | 0 refills | Status: AC

## 2022-08-03 ENCOUNTER — Encounter: Payer: Self-pay | Admitting: Family Medicine

## 2022-08-03 ENCOUNTER — Ambulatory Visit: Payer: BC Managed Care – PPO | Admitting: Family Medicine

## 2022-08-03 VITALS — BP 128/90 | HR 87 | Temp 98.3°F | Resp 18 | Ht 69.0 in | Wt 216.8 lb

## 2022-08-03 DIAGNOSIS — H6991 Unspecified Eustachian tube disorder, right ear: Secondary | ICD-10-CM

## 2022-08-03 DIAGNOSIS — J040 Acute laryngitis: Secondary | ICD-10-CM | POA: Diagnosis not present

## 2022-08-03 DIAGNOSIS — R030 Elevated blood-pressure reading, without diagnosis of hypertension: Secondary | ICD-10-CM

## 2022-08-03 MED ORDER — AMOXICILLIN-POT CLAVULANATE 875-125 MG PO TABS: 1.0000 | ORAL_TABLET | Freq: Two times a day (BID) | ORAL | 0 refills | Status: AC

## 2022-08-03 MED ORDER — PREDNISONE 20 MG PO TABS: 40.0000 mg | ORAL_TABLET | Freq: Every day | ORAL | 0 refills | Status: AC

## 2022-08-03 NOTE — Patient Instructions (Signed)
Continue to push fluids, practice good hand hygiene, and cover your mouth if you cough.  If you start having fevers, shaking or shortness of breath, seek immediate care.  OK to take Tylenol 1000 mg (2 extra strength tabs) or 975 mg (3 regular strength tabs) every 6 hours as needed.  Take the antibiotic if no better in 2-3 days.   Let us know if you need anything.

## 2022-08-03 NOTE — Progress Notes (Signed)
Chief Complaint  Patient presents with   Cough    Pt states sxs started Friday and Saturday. No COVID test. Pt reports chills and body aches.     Lyman Bishop here for URI complaints.  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   History of stomach ulcers    Joint pain    Lactose intolerance    Osteoarthritis    Other fatigue    Vitamin D deficiency     Objective BP (!) 128/90 (BP Location: Left Arm, Patient Position: Sitting, Cuff Size: Large)   Pulse 87   Temp 98.3 F (36.8 C) (Oral)   Resp 18   Ht 5' 9"  (1.753 m)   Wt 216 lb 12.8 oz (98.3 kg)   SpO2 98%   BMI 32.02 kg/m  General: Awake, alert, appears stated age HEENT: AT, [place], ears patent b/l and TM's neg, nares patent w/o discharge, pharynx pink and without exudates, MMM Neck: No masses or asymmetry Heart: RRR Lungs: CTAB, no accessory muscle use Psych: Age appropriate judgment and insight, normal mood and affect  Dysfunction of right eustachian tube - Plan: predniSONE (DELTASONE) 20 MG tablet  Laryngitis  5 d pred burst 40 mg/d.  Continue to push fluids, practice good hand hygiene, cover mouth when coughing. Will take 7 d of Augmentin if no improvement in next 2-3 d. F/u prn. If starting to experience fevers, shaking, or shortness of breath, seek immediate care. Has been taking meds w pseudoephedrine lately, will  monitor at home.  Pt voiced understanding and agreement to the plan.  [place], DO 08/03/22 2:08 PM

## 2022-08-19 ENCOUNTER — Encounter: Payer: Self-pay | Admitting: Family Medicine

## 2022-08-26 ENCOUNTER — Encounter: Payer: Self-pay | Admitting: Family Medicine

## 2022-08-26 ENCOUNTER — Telehealth (INDEPENDENT_AMBULATORY_CARE_PROVIDER_SITE_OTHER): Payer: BC Managed Care – PPO | Admitting: Family Medicine

## 2022-08-26 DIAGNOSIS — J069 Acute upper respiratory infection, unspecified: Secondary | ICD-10-CM | POA: Diagnosis not present

## 2022-08-26 MED ORDER — BENZONATATE 200 MG PO CAPS: 200.0000 mg | ORAL_CAPSULE | Freq: Two times a day (BID) | ORAL | 0 refills | Status: DC | PRN

## 2022-08-26 MED ORDER — PROMETHAZINE HCL 6.25 MG/5ML PO SYRP: 6.2500 mg | ORAL_SOLUTION | Freq: Four times a day (QID) | ORAL | 0 refills | Status: DC | PRN

## 2022-08-26 NOTE — Progress Notes (Signed)
Chief Complaint  Patient presents with   Cough    [facility] here for URI complaints. Due to COVID-19 pandemic, we are interacting via web portal for an electronic face-to-face visit. I verified patient's ID using 2 identifiers. Patient agreed to proceed with visit via this method. Patient is in a parked car, I am at office. Patient and I are present for visit.   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  She may have the flu, discussed Tamiflu briefly but decided against it.  She is leaving the 48-hour window and is overall healthy at baseline.  Continue to push fluids, practice good hand hygiene, cover mouth when coughing. F/u prn. If starting to experience worsening symptoms, shaking, or shortness of breath, seek immediate care. Pt voiced understanding and agreement to the plan.  [place], DO 08/26/22 4:41 PM

## 2022-08-28 ENCOUNTER — Encounter: Payer: Self-pay | Admitting: Family Medicine

## 2022-10-10 ENCOUNTER — Encounter: Payer: Self-pay | Admitting: Family Medicine

## 2022-10-12 ENCOUNTER — Encounter: Payer: Self-pay | Admitting: Family Medicine

## 2022-11-15 ENCOUNTER — Encounter (INDEPENDENT_AMBULATORY_CARE_PROVIDER_SITE_OTHER): Payer: Self-pay

## 2022-12-26 ENCOUNTER — Encounter: Payer: Self-pay | Admitting: *Deleted

## 2023-01-31 ENCOUNTER — Telehealth: Payer: BC Managed Care – PPO | Admitting: Family Medicine

## 2023-01-31 DIAGNOSIS — J019 Acute sinusitis, unspecified: Secondary | ICD-10-CM

## 2023-01-31 MED ORDER — AMOXICILLIN-POT CLAVULANATE 875-125 MG PO TABS: 1.0000 | ORAL_TABLET | Freq: Two times a day (BID) | ORAL | 0 refills | Status: AC

## 2023-01-31 NOTE — Progress Notes (Signed)

## 2023-02-27 ENCOUNTER — Encounter: Payer: Self-pay | Admitting: Family Medicine

## 2023-02-28 ENCOUNTER — Encounter: Payer: Self-pay | Admitting: Family Medicine

## 2023-02-28 ENCOUNTER — Telehealth (INDEPENDENT_AMBULATORY_CARE_PROVIDER_SITE_OTHER): Payer: BC Managed Care – PPO | Admitting: Family Medicine

## 2023-02-28 DIAGNOSIS — J0100 Acute maxillary sinusitis, unspecified: Secondary | ICD-10-CM

## 2023-02-28 MED ORDER — DOXYCYCLINE HYCLATE 100 MG PO TABS
100.0000 mg | ORAL_TABLET | Freq: Two times a day (BID) | ORAL | 0 refills | Status: DC
Start: 2023-03-02 — End: 2023-06-22

## 2023-02-28 MED ORDER — PREDNISONE 20 MG PO TABS
40.0000 mg | ORAL_TABLET | Freq: Every day | ORAL | 0 refills | Status: AC
Start: 2023-02-28 — End: 2023-03-05

## 2023-02-28 NOTE — Progress Notes (Signed)
Chief Complaint  Patient presents with   Sinusitis    [name] here for URI complaints. We are interacting via web portal for an electronic face-to-face visit. I verified patient's ID using 2 identifiers. Patient agreed to proceed with visit via this method. Patient is in a parked car, I am at office. Patient and I are present for visit.   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 10 d of doxy if no better in 2 d. Continue to push fluids, practice good hand hygiene, cover mouth when coughing. F/u prn. If starting to experience fevers, shaking, or shortness of breath, seek immediate care. Pt voiced understanding and agreement to the plan.  Sheila Roche [place], DO 02/28/23 3:25 PM

## 2023-04-15 ENCOUNTER — Encounter: Payer: Self-pay | Admitting: Family Medicine

## 2023-06-22 ENCOUNTER — Telehealth: Payer: BC Managed Care – PPO | Admitting: Physician Assistant

## 2023-06-22 DIAGNOSIS — J019 Acute sinusitis, unspecified: Secondary | ICD-10-CM | POA: Diagnosis not present

## 2023-06-22 DIAGNOSIS — B9689 Other specified bacterial agents as the cause of diseases classified elsewhere: Secondary | ICD-10-CM | POA: Diagnosis not present

## 2023-06-22 MED ORDER — AMOXICILLIN-POT CLAVULANATE 875-125 MG PO TABS
1.0000 | ORAL_TABLET | Freq: Two times a day (BID) | ORAL | 0 refills | Status: DC
Start: 2023-06-22 — End: 2023-08-09

## 2023-06-22 NOTE — Progress Notes (Signed)
I have spent 5 minutes in review of e-visit questionnaire, review and updating patient chart, medical decision making and response to patient.   Mia Milan Cody Jacklynn Dehaas, PA-C    

## 2023-06-22 NOTE — Progress Notes (Signed)

## 2023-08-09 ENCOUNTER — Telehealth: Payer: BC Managed Care – PPO | Admitting: Physician Assistant

## 2023-08-09 DIAGNOSIS — J019 Acute sinusitis, unspecified: Secondary | ICD-10-CM

## 2023-08-09 DIAGNOSIS — B9689 Other specified bacterial agents as the cause of diseases classified elsewhere: Secondary | ICD-10-CM

## 2023-08-09 MED ORDER — AMOXICILLIN-POT CLAVULANATE 875-125 MG PO TABS
1.0000 | ORAL_TABLET | Freq: Two times a day (BID) | ORAL | 0 refills | Status: DC
Start: 2023-08-09 — End: 2023-08-31

## 2023-08-09 NOTE — Progress Notes (Signed)

## 2023-08-09 NOTE — Progress Notes (Signed)
I have spent 5 minutes in review of e-visit questionnaire, review and updating patient chart, medical decision making and response to patient.   Mia Milan Cody Jacklynn Dehaas, PA-C    

## 2023-08-15 ENCOUNTER — Telehealth: Payer: BC Managed Care – PPO | Admitting: Family Medicine

## 2023-08-28 ENCOUNTER — Telehealth: Payer: BC Managed Care – PPO

## 2023-08-29 ENCOUNTER — Telehealth: Payer: BC Managed Care – PPO

## 2023-08-31 ENCOUNTER — Telehealth: Payer: BC Managed Care – PPO | Admitting: Physician Assistant

## 2023-08-31 DIAGNOSIS — J329 Chronic sinusitis, unspecified: Secondary | ICD-10-CM

## 2023-08-31 MED ORDER — PREDNISONE 10 MG (21) PO TBPK
ORAL_TABLET | ORAL | 0 refills | Status: DC
Start: 2023-08-31 — End: 2023-09-26

## 2023-08-31 MED ORDER — AMOXICILLIN-POT CLAVULANATE 875-125 MG PO TABS
1.0000 | ORAL_TABLET | Freq: Two times a day (BID) | ORAL | 0 refills | Status: DC
Start: 2023-08-31 — End: 2023-09-26

## 2023-08-31 NOTE — Patient Instructions (Signed)
Carlynn Spry, thank you for joining [name], PA-C for today's virtual visit.  While this provider is not your primary care provider (PCP), if your PCP is located in our provider database this encounter information will be shared with them immediately following your visit.   A [facility] MyChart account gives you access to today's visit and all your visits, tests, and labs performed at [facility] " click here if you don't have a [facility] MyChart account or go to [email or web address]  Consent: (Patient) Sheila Lam provided verbal consent for this virtual visit at the beginning of the encounter.  Current Medications:  Current Outpatient Medications:    amoxicillin-clavulanate (AUGMENTIN) 875-125 MG tablet, Take 1 tablet by mouth 2 (two) times daily., Disp: 14 tablet, Rfl: 0   Digestive Enzymes CAPS, Take 1 capsule by mouth in the morning and at bedtime., Disp: , Rfl:    fluticasone (FLONASE) 50 MCG/ACT nasal spray, Place 2 sprays into both nostrils daily., Disp: 16 g, Rfl: 6   levalbuterol (XOPENEX HFA) 45 MCG/ACT inhaler, Inhale 1-2 puffs into the lungs every 6 (six) hours as needed for wheezing., Disp: 45 g, Rfl: 2   Medications ordered in this encounter:  No orders of the defined types were placed in this encounter.    *If you need refills on other medications prior to your next appointment, please contact your pharmacy*  Follow-Up: Call back or seek an in-person evaluation if the symptoms worsen or if the condition fails to improve as anticipated.  [facility] [phone]  Other Instructions Please take antibiotic as directed.  Increase fluid intake.  Use Saline nasal spray.  Take a daily multivitamin. Prednisone as directed.  Place a humidifier in the bedroom.  Please call or return clinic if symptoms are not improving.  Sinusitis Sinusitis is redness, soreness, and swelling (inflammation) of the paranasal sinuses. Paranasal  sinuses are air pockets within the bones of your face (beneath the eyes, the middle of the forehead, or above the eyes). In healthy paranasal sinuses, mucus is able to drain out, and air is able to circulate through them by way of your nose. However, when your paranasal sinuses are inflamed, mucus and air can become trapped. This can allow bacteria and other germs to grow and cause infection. Sinusitis can develop quickly and last only a short time (acute) or continue over a long period (chronic). Sinusitis that lasts for more than 12 weeks is considered chronic.  CAUSES  Causes of sinusitis include: Allergies. Structural abnormalities, such as displacement of the cartilage that separates your nostrils (deviated septum), which can decrease the air flow through your nose and sinuses and affect sinus drainage. Functional abnormalities, such as when the small hairs (cilia) that line your sinuses and help remove mucus do not work properly or are not present. SYMPTOMS  Symptoms of acute and chronic sinusitis are the same. The primary symptoms are pain and pressure around the affected sinuses. Other symptoms include: Upper toothache. Earache. Headache. Bad breath. Decreased sense of smell and taste. A cough, which worsens when you are lying flat. Fatigue. Fever. Thick drainage from your nose, which often is green and may contain pus (purulent). Swelling and warmth over the affected sinuses. DIAGNOSIS  Your caregiver will perform a physical exam. During the exam, your caregiver may: Look in your nose for signs of abnormal growths in your nostrils (nasal polyps). Tap over the affected sinus to check for signs of infection. View the inside  of your sinuses (endoscopy) with a special imaging device with a light attached (endoscope), which is inserted into your sinuses. If your caregiver suspects that you have chronic sinusitis, one or more of the following tests may be recommended: Allergy  tests. Nasal culture A sample of mucus is taken from your nose and sent to a lab and screened for bacteria. Nasal cytology A sample of mucus is taken from your nose and examined by your caregiver to determine if your sinusitis is related to an allergy. TREATMENT  Most cases of acute sinusitis are related to a viral infection and will resolve on their own within 10 days. Sometimes medicines are prescribed to help relieve symptoms (pain medicine, decongestants, nasal steroid sprays, or saline sprays).  However, for sinusitis related to a bacterial infection, your caregiver will prescribe antibiotic medicines. These are medicines that will help kill the bacteria causing the infection.  Rarely, sinusitis is caused by a fungal infection. In theses cases, your caregiver will prescribe antifungal medicine. For some cases of chronic sinusitis, surgery is needed. Generally, these are cases in which sinusitis recurs more than 3 times per year, despite other treatments. HOME CARE INSTRUCTIONS  Drink plenty of water. Water helps thin the mucus so your sinuses can drain more easily. Use a humidifier. Inhale steam 3 to 4 times a day (for example, sit in the bathroom with the shower running). Apply a warm, moist washcloth to your face 3 to 4 times a day, or as directed by your caregiver. Use saline nasal sprays to help moisten and clean your sinuses. Take over-the-counter or prescription medicines for pain, discomfort, or fever only as directed by your caregiver. SEEK IMMEDIATE MEDICAL CARE IF: You have increasing pain or severe headaches. You have nausea, vomiting, or drowsiness. You have swelling around your face. You have vision problems. You have a stiff neck. You have difficulty breathing. MAKE SURE YOU:  Understand these instructions. Will watch your condition. Will get help right away if you are not doing well or get worse. Document Released: 08/29/2005 Document Revised: 11/21/2011 Document  Reviewed: 09/13/2011 [facility] Patient Information 2014 [place], [place].    If you have been instructed to have an in-person evaluation today at a local Urgent Care facility, please use the link below. It will take you to a list of all of our available [facility], including address, phone number and hours of operation. Please do not delay care.  [facility]  If you or a family member do not have a primary care provider, use the link below to schedule a visit and establish care. When you choose a [facility] primary care physician or advanced practice provider, you gain a long-term partner in health. Find a Primary Care Provider  Learn more about [facility]'s in-office and virtual care options: [facility] - Get Care Now

## 2023-08-31 NOTE — Progress Notes (Signed)
Virtual Visit Consent   Sheila Lam, you are scheduled for a virtual visit with a [facility] provider today. Just as with appointments in the office, your consent must be obtained to participate. Your consent will be active for this visit and any virtual visit you may have with one of our providers in the next 365 days. If you have a MyChart account, a copy of this consent can be sent to you electronically.  As this is a virtual visit, video technology does not allow for your provider to perform a traditional examination. This may limit your provider's ability to fully assess your condition. If your provider identifies any concerns that need to be evaluated in person or the need to arrange testing (such as labs, EKG, etc.), we will make arrangements to do so. Although advances in technology are sophisticated, we cannot ensure that it will always work on either your end or our end. If the connection with a video visit is poor, the visit may have to be switched to a telephone visit. With either a video or telephone visit, we are not always able to ensure that we have a secure connection.  By engaging in this virtual visit, you consent to the provision of healthcare and authorize for your insurance to be billed (if applicable) for the services provided during this visit. Depending on your insurance coverage, you may receive a charge related to this service.  I need to obtain your verbal consent now. Are you willing to proceed with your visit today? [name] has provided verbal consent on 08/31/2023 for a virtual visit (video or telephone). Sheila Lam, [place]  Date: 08/31/2023 9:31 AM  Virtual Visit via Video Note   I, Sheila Lam, connected with  Sheila Lam  ([id number], 1966/07/09) on 08/31/23 at  9:30 AM EST by a video-enabled telemedicine application and verified that I am speaking with the correct person using two identifiers.  Location: Patient: Virtual Visit  Location Patient: Home Provider: Virtual Visit Location Provider: Home Office   I discussed the limitations of evaluation and management by telemedicine and the availability of in person appointments. The patient expressed understanding and agreed to proceed.    History of Present Illness: Sheila Lam is a 57 y.o. who identifies as a female who was assigned female at birth, and is being seen today for concern of a reoccurring sinus infection. Was seen 11/27 for sinusitis and treated wth 7-day course of Augmentin. Notes this helped symptoms but did not fully resolve them. Some lingering nasal congestion. 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 Now again with increased sinus pain, ear pain, thick drainage. Some swollen lymph nodes in her anterior neck. No true fever but notes some sweats at night with this.   HPI: HPI  Problems:  Patient Active Problem List   Diagnosis Date Noted   Sports hernia 10/21/2021   Bronchitis 07/26/2021   Acute cough 07/26/2021   Sicca complex (HCC) 01/26/2021   Class 1 obesity due to excess calories without serious comorbidity with body mass index (BMI) of 32.0 to 32.9 in adult 01/26/2021   Overweight 04/20/2013   Celiac disease 04/18/2013   DJD (degenerative joint disease) 04/18/2013   S/P hysterectomy 04/18/2013    Allergies:  Allergies  Allergen Reactions   Lactose Dermatitis, Diarrhea and Shortness Of Breath   Cephalexin Diarrhea and Nausea And Vomiting  Doxycycline Diarrhea and Nausea And Vomiting   Azithromycin     Causes increased congestion, can not tolerate.   Codeine Nausea And Vomiting   Gluten Meal     Celiac disease   Soy Allergy (Do Not Select)     Breast swelling   Medications:  Current Outpatient Medications:    amoxicillin-clavulanate (AUGMENTIN) 875-125 MG tablet, Take 1 tablet by mouth 2 (two) times daily., Disp: 20 tablet, Rfl: 0    predniSONE (STERAPRED UNI-PAK 21 TAB) 10 MG (21) TBPK tablet, Take following package directions, Disp: 21 tablet, Rfl: 0   Digestive Enzymes CAPS, Take 1 capsule by mouth in the morning and at bedtime., Disp: , Rfl:    fluticasone (FLONASE) 50 MCG/ACT nasal spray, Place 2 sprays into both nostrils daily., Disp: 16 g, Rfl: 6   levalbuterol (XOPENEX HFA) 45 MCG/ACT inhaler, Inhale 1-2 puffs into the lungs every 6 (six) hours as needed for wheezing., Disp: 45 g, Rfl: 2  Observations/Objective: Patient is well-developed, well-nourished in no acute distress.  Resting comfortably at home.  Head is normocephalic, atraumatic.  No labored breathing. Speech is clear and coherent with logical content.  Patient is alert and oriented at baseline.   Assessment and Plan: 1. Recurrent sinus infections (Primary) - amoxicillin-clavulanate (AUGMENTIN) 875-125 MG tablet; Take 1 tablet by mouth 2 (two) times daily.  Dispense: 20 tablet; Refill: 0 - predniSONE (STERAPRED UNI-PAK 21 TAB) 10 MG (21) TBPK tablet; Take following package directions  Dispense: 21 tablet; Refill: 0  Rx Augmentin.  Increase fluids.  Rest.  Saline nasal spray.  Probiotic.  Mucinex as directed.  Humidifier in bedroom. Prednisone per orders.  Call or return to clinic if symptoms are not improving.   Follow Up Instructions: I discussed the assessment and treatment plan with the patient. The patient was provided an opportunity to ask questions and all were answered. The patient agreed with the plan and demonstrated an understanding of the instructions.  A copy of instructions were sent to the patient via MyChart unless otherwise noted below.   The patient was advised to call back or seek an in-person evaluation if the symptoms worsen or if the condition fails to improve as anticipated.    Sheila Climes, PA-C

## 2023-09-04 ENCOUNTER — Telehealth: Payer: BC Managed Care – PPO | Admitting: Family Medicine

## 2023-09-25 ENCOUNTER — Other Ambulatory Visit: Payer: Self-pay | Admitting: Family Medicine

## 2023-09-25 DIAGNOSIS — J0100 Acute maxillary sinusitis, unspecified: Secondary | ICD-10-CM

## 2023-09-26 ENCOUNTER — Other Ambulatory Visit (HOSPITAL_BASED_OUTPATIENT_CLINIC_OR_DEPARTMENT_OTHER): Payer: Self-pay

## 2023-09-26 ENCOUNTER — Encounter: Payer: Self-pay | Admitting: Family Medicine

## 2023-09-26 ENCOUNTER — Ambulatory Visit (INDEPENDENT_AMBULATORY_CARE_PROVIDER_SITE_OTHER): Payer: 59 | Admitting: Family Medicine

## 2023-09-26 VITALS — BP 128/84 | HR 84 | Temp 98.0°F | Resp 16 | Ht 69.0 in | Wt 234.2 lb

## 2023-09-26 DIAGNOSIS — R0789 Other chest pain: Secondary | ICD-10-CM

## 2023-09-26 DIAGNOSIS — J0140 Acute pansinusitis, unspecified: Secondary | ICD-10-CM

## 2023-09-26 MED ORDER — CEFTRIAXONE SODIUM 1 G IJ SOLR
1.0000 g | Freq: Once | INTRAMUSCULAR | Status: AC
Start: 2023-09-26 — End: 2023-09-26
  Administered 2023-09-26: 1 g via INTRAMUSCULAR

## 2023-09-26 MED ORDER — CEFDINIR 300 MG PO CAPS
300.0000 mg | ORAL_CAPSULE | Freq: Two times a day (BID) | ORAL | 0 refills | Status: AC
  Filled 2023-09-26: qty 10, 5d supply, fill #0

## 2023-09-26 NOTE — Progress Notes (Signed)
 Chief Complaint  Patient presents with   Sinusitis    Sinusitis     Sheila Lam [facility] here for URI complaints.  Duration: 3 weeks  Associated symptoms: sinus congestion, sinus pain, rhinorrhea, and ear pain Denies: itchy watery eyes, ear drainage, sore throat, wheezing, shortness of breath, myalgia, and fevers Treatment to date: Augmentin  and prednisone  x 2, Advil, Tylenol,  Sick contacts: Yes- students at school, spouse  Chest squeezing Duration of issue: 3 weeks Quality: squeezing and then feels a bubbly/trickling feeling Palliation: none Provocation: none Not a/w exertion Radiation: none Duration of chest squeezing: 10  sec Associated symptoms: no SOB,  Cardiac history: none as adult Family heart history: heart disease on mom's side of the family Smoker? 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   History of stomach ulcers    Joint pain    Lactose intolerance    Osteoarthritis    Other fatigue    Vitamin D  deficiency     Objective BP 128/84   Pulse 84   Temp 98 F (36.7 C) (Oral)   Resp 16   Ht 5' 9 (1.753 m)   Wt 234 lb 3.2 oz (106.2 kg)   SpO2 96%   BMI 34.59 kg/m  General: Awake, alert, appears stated age HEENT: AT, [place], ears patent b/l and TM's neg, nares patent w/o discharge, pharynx pink and without exudates, MMM Neck: No masses or asymmetry Heart: RRR Lungs: CTAB, no accessory muscle use MSK: TTP over the left anterior chest wall at the midclavicular line near ribs 3 through 5; no deformity or edema appreciated Psych: Age appropriate judgment and insight, normal mood and affect  Subacute pansinusitis - Plan: cefTRIAXone  (ROCEPHIN )  injection 1 g  Atypical chest pain - Plan: EKG 12-Lead  Rocephin  injection today.  She has tolerated this well in the past.    She has failed Augmentin  and has several allergies/intolerances to other medications.  Continue to push fluids, practice good hand hygiene, cover mouth when coughing. F/u prn. If starting to experience fevers, shaking, or shortness of breath, seek immediate care.  I did reach out to the pharmacy team to discuss her case given the gluten sensitivity. Chest pain is reproducible to palpation suggesting a musculoskeletal cause.  No exertional symptoms.  She has few risk factors. EKG shows NSR, normal axis, no interval abnormalities, no ST segment or T wave changes, good R wave progression.  Pt voiced understanding and agreement to the plan.  I spent 35 min with the pt discussing the above plans in addition to reviewing her chart and coordinating care with the pharmacy team on the same day of the visit.   Sheila [place], DO 09/26/23 4:42 PM

## 2023-09-26 NOTE — Patient Instructions (Addendum)
 I will be in touch with you regarding our plan starting tomorrow.  Continue to push fluids, practice good hand hygiene, and cover your mouth if you cough.  If you start having fevers, shaking or shortness of breath, seek immediate care.  OK to take Tylenol 1000 mg (2 extra strength tabs) or 975 mg (3 regular strength tabs) every 6 hours as needed.  Let us  know if you need anything.  Pectoralis Major Rehab Ask your health care provider which exercises are safe for you. Do exercises exactly as told by your health care provider and adjust them as directed. It is normal to feel mild stretching, pulling, tightness, or discomfort as you do these exercises, but you should stop right away if you feel sudden pain or your pain gets worse. Do not begin these exercises until told by your health care provider. Stretching and range of motion exercises These exercises warm up your muscles and joints and improve the movement and flexibility of your shoulder. These exercises can also help to relieve pain, numbness, and tingling. Exercise A: Pendulum  Stand near a wall or a surface that you can hold onto for balance. Bend at the waist and let your left / right arm hang straight down. Use your other arm to keep your balance. Relax your arm and shoulder muscles, and move your hips and your trunk so your left / right arm swings freely. Your arm should swing because of the motion of your body, not because you are using your arm or shoulder muscles. Keep moving so your arm swings in the following directions, as told by your health care provider: Side to side. Forward and backward. In clockwise and counterclockwise circles. Slowly return to the starting position. Repeat 2 times. Complete this exercise 3 times per week. Exercise B: Abduction, standing Stand and hold a broomstick, a cane, or a similar object. Place your hands a little more than shoulder-width apart on the object. Your left / right hand should be  palm-up, and your other hand should be palm-down. While keeping your elbow straight and your shoulder muscles relaxed, push the stick across your body toward your left / right side. Raise your left / right arm to the side of your body and then over your head until you feel a stretch in your shoulder. Stop when you reach the angle that is recommended by your health care provider. Avoid shrugging your shoulder while you raise your arm. Keep your shoulder blade tucked down toward the middle of your spine. Hold for 10 seconds. Slowly return to the starting position. Repeat 2 times. Complete this exercise 3 times per week. Exercise C: Wand flexion, supine  Lie on your back. You may bend your knees for comfort. Hold a broomstick, a cane, or a similar object so that your hands are about shoulder-width apart on the object. Your palms should face toward your feet. Raise your left / right arm in front of your face, then behind your head (toward the floor). Use your other hand to help you do this. Stop when you feel a gentle stretch in your shoulder, or when you reach the angle that is recommended by your health care provider. Hold for 3 seconds. Use the broomstick and your other arm to help you return your left / right arm to the starting position. Repeat 2 times. Complete this exercise 3 times per week. Exercise D: Wand shoulder external rotation Stand and hold a broomstick, a cane, or a similar object so your  hands are about shoulder-width apart on the object. Start with your arms hanging down, then bend both elbows to an L shape (90 degrees). Keep your left / right elbow at your side. Use your other hand to push the stick so your left / right forearm moves away from your body, out to your side. Keep your left / right elbow bent to 90 degrees and keep it against your side. Stop when you feel a gentle stretch in your shoulder, or when you reach the angle recommended by your health care provider. Hold  for 10 seconds. Use the stick to help you return your left / right arm to the starting position. Repeat 2 times. Complete this exercise 3 times per week. Strengthening exercises These exercises build strength and endurance in your shoulder. Endurance is the ability to use your muscles for a long time, even after your muscles get tired. Exercise E: Scapular protraction, standing Stand so you are facing a wall. Place your feet about one arm-length away from the wall. Place your hands on the wall and straighten your elbows. Keep your hands on the wall as you push your upper back away from the wall. You should feel your shoulder blades sliding forward. Keep your elbows and your head still. If you are not sure that you are doing this exercise correctly, ask your health care provider for more instructions. Hold for 3 seconds. Slowly return to the starting position. Let your muscles relax completely before you repeat this exercise. Repeat 2 times. Complete this exercise 3 times per week. Exercise F: Shoulder blade squeezes  (scapular retraction) Sit with good posture in a stable chair. Do not let your back touch the back of the chair. Your arms should be at your sides with your elbows bent. You may rest your forearms on a pillow if that is more comfortable. Squeeze your shoulder blades together. Bring them down and back. Keep your shoulders level. Do not lift your shoulders up toward your ears. Hold for 3 seconds. Return to the starting position. Repeat 2 times. Complete this exercise 3 times per week. This information is not intended to replace advice given to you by your health care provider. Make sure you discuss any questions you have with your health care provider. Document Released: 08/29/2005 Document Revised: 06/09/2016 Document Reviewed: 05/17/2015 Elsevier Interactive Patient Education  [employer].

## 2023-10-02 ENCOUNTER — Ambulatory Visit (INDEPENDENT_AMBULATORY_CARE_PROVIDER_SITE_OTHER): Payer: 59 | Admitting: *Deleted

## 2023-10-02 DIAGNOSIS — J0140 Acute pansinusitis, unspecified: Secondary | ICD-10-CM

## 2023-10-02 MED ORDER — CEFTRIAXONE SODIUM 1 G IJ SOLR
1.0000 g | Freq: Once | INTRAMUSCULAR | Status: AC
Start: 2023-10-02 — End: 2023-10-02
  Administered 2023-10-02: 1 g via INTRAMUSCULAR

## 2023-10-02 NOTE — Progress Notes (Signed)
Pt in for Rocephin 1g injection per pcp order.  Given LUOQ without complication.

## 2023-10-04 NOTE — Telephone Encounter (Signed)
[facility] notes has been printed and scan in her chart

## 2023-10-06 ENCOUNTER — Other Ambulatory Visit (INDEPENDENT_AMBULATORY_CARE_PROVIDER_SITE_OTHER): Payer: 59

## 2023-10-06 ENCOUNTER — Encounter: Payer: Self-pay | Admitting: Family Medicine

## 2023-10-06 ENCOUNTER — Other Ambulatory Visit: Payer: 59

## 2023-10-06 DIAGNOSIS — Z111 Encounter for screening for respiratory tuberculosis: Secondary | ICD-10-CM

## 2023-10-09 ENCOUNTER — Encounter: Payer: Self-pay | Admitting: Family Medicine

## 2023-10-09 LAB — QUANTIFERON-TB GOLD PLUS
Mitogen-NIL: >10 [IU]/mL
NIL: 0.03 [IU]/mL
QuantiFERON-TB Gold Plus: NEGATIVE
TB1-NIL: 0.02 [IU]/mL
TB2-NIL: 0.01 [IU]/mL

## 2023-10-10 NOTE — Telephone Encounter (Signed)
Called pt was advised we have her form ready when she comes in the office tomorrow let them know Byard Carranza has the form.

## 2023-10-11 ENCOUNTER — Ambulatory Visit (INDEPENDENT_AMBULATORY_CARE_PROVIDER_SITE_OTHER): Payer: 59 | Admitting: Neurology

## 2023-10-11 DIAGNOSIS — J0140 Acute pansinusitis, unspecified: Secondary | ICD-10-CM

## 2023-10-11 MED ORDER — CEFTRIAXONE SODIUM 1 G IJ SOLR
1.0000 g | Freq: Once | INTRAMUSCULAR | Status: AC
  Administered 2023-10-11: 1 g via INTRAMUSCULAR

## 2023-10-11 NOTE — Progress Notes (Signed)
Patient presents for antibiotic injection.  Given 1 gm Rocephin on 10/02/2023.  Per Dr. Carmelia Roller: Sharlene Dory, DO 10/09/23  7:03 AM "I am fine with scheduling one more. This would be the last one before exploring other options though."   Rocephin 1g given LUOQ without apparent complication.

## 2023-11-22 ENCOUNTER — Ambulatory Visit: Admitting: Family Medicine

## 2023-11-27 ENCOUNTER — Ambulatory Visit: Admitting: Family Medicine

## 2024-01-22 ENCOUNTER — Telehealth: Admitting: Family Medicine

## 2024-02-07 ENCOUNTER — Telehealth: Admitting: Physician Assistant

## 2024-02-07 DIAGNOSIS — J019 Acute sinusitis, unspecified: Secondary | ICD-10-CM

## 2024-02-07 DIAGNOSIS — B9689 Other specified bacterial agents as the cause of diseases classified elsewhere: Secondary | ICD-10-CM

## 2024-02-07 MED ORDER — AMOXICILLIN-POT CLAVULANATE 875-125 MG PO TABS
1.0000 | ORAL_TABLET | Freq: Two times a day (BID) | ORAL | 0 refills | Status: DC
Start: 2024-02-07 — End: 2024-04-02

## 2024-02-07 NOTE — Progress Notes (Signed)

## 2024-02-07 NOTE — Progress Notes (Signed)
 I have spent 5 minutes in review of e-visit questionnaire, review and updating patient chart, medical decision making and response to patient.   [name], PA-C

## 2024-02-19 ENCOUNTER — Ambulatory Visit: Admitting: Family Medicine

## 2024-03-27 ENCOUNTER — Encounter: Payer: Self-pay | Admitting: Family Medicine

## 2024-03-27 ENCOUNTER — Other Ambulatory Visit: Payer: Self-pay | Admitting: Family Medicine

## 2024-03-27 MED ORDER — ONDANSETRON 4 MG PO TBDP: 4.0000 mg | ORAL_TABLET | Freq: Three times a day (TID) | ORAL | 0 refills | Status: DC | PRN

## 2024-03-28 ENCOUNTER — Other Ambulatory Visit: Payer: Self-pay

## 2024-03-28 ENCOUNTER — Telehealth: Payer: Self-pay

## 2024-03-28 DIAGNOSIS — R051 Acute cough: Secondary | ICD-10-CM

## 2024-03-28 DIAGNOSIS — R112 Nausea with vomiting, unspecified: Secondary | ICD-10-CM

## 2024-03-28 MED ORDER — ONDANSETRON HCL 4 MG PO TABS: 4.0000 mg | ORAL_TABLET | Freq: Three times a day (TID) | ORAL | 0 refills | Status: DC | PRN

## 2024-03-28 NOTE — Telephone Encounter (Signed)
 Copied from CRM [id number]. Topic: Clinical - Prescription Issue >> Mar 28, 2024  2:20 PM Robinson H wrote: Reason for CRM: Patient states the ondansetron  (ZOFRAN -ODT) 4 MG disintegrating tablets were called in and are strawberry flavored, patient states she's allergic to an ingredient in the dissolving tablets and want the regular pills sent in and not the dissolving ones.  Kylin [phone]

## 2024-03-28 NOTE — Telephone Encounter (Signed)
 Rx changed by PCP.

## 2024-03-28 NOTE — Telephone Encounter (Signed)
 Strawberries not listed on allergy list. Please advise.

## 2024-04-02 ENCOUNTER — Encounter: Payer: Self-pay | Admitting: Family Medicine

## 2024-04-02 ENCOUNTER — Ambulatory Visit (INDEPENDENT_AMBULATORY_CARE_PROVIDER_SITE_OTHER): Admitting: Family Medicine

## 2024-04-02 VITALS — BP 126/80 | HR 76 | Temp 98.0°F | Resp 16 | Ht 69.0 in | Wt 223.0 lb

## 2024-04-02 DIAGNOSIS — R5383 Other fatigue: Secondary | ICD-10-CM | POA: Diagnosis not present

## 2024-04-02 DIAGNOSIS — L659 Nonscarring hair loss, unspecified: Secondary | ICD-10-CM

## 2024-04-02 DIAGNOSIS — R002 Palpitations: Secondary | ICD-10-CM

## 2024-04-02 DIAGNOSIS — I889 Nonspecific lymphadenitis, unspecified: Secondary | ICD-10-CM | POA: Diagnosis not present

## 2024-04-02 MED ORDER — PREDNISONE 20 MG PO TABS: 40.0000 mg | ORAL_TABLET | Freq: Every day | ORAL | 0 refills | Status: AC

## 2024-04-02 NOTE — Progress Notes (Signed)
 Chief Complaint  Patient presents with   Ear Pain    Pain behind Ear and low blood sugar    Subjective: Patient is a 58 y.o. female here for f/u.  5 weeks ago woke up and her L ear was swollen. She has been having pain behind her ear since then. She has assocaited shaking, palpitations, and fatigue.  Her shaking will take place intermittently.  She wonders if she has low sugar.  On one occasion when it happened, she ate and did not notice much improvement.  She is not diabetic.  She has not checked her sugar nor her blood pressure during these episodes.  Mood/anxiety is good.  She denies fevers, ST, productive cough, sick contacts, dehydration.   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 No accessory muscle use Skin: No obvious lesions in the postauricular area on the left.  There was a small lymph node that was TTP.  No swelling or fluctuance.  There is no drainage. Ears: No external lesions along the ear or asymmetry.  There is no edema.  Canals are patent without otorrhea.  TM negative. Psych: Age appropriate judgment and insight, normal affect and mood  Assessment and Plan: Palpitations - Plan: CBC, Comprehensive metabolic panel with GFR, Magnesium  Fatigue, unspecified  type  Hair thinning - Plan: TSH, VITAMIN D  25 Hydroxy (Vit-D Deficiency, Fractures), T4, free, IBC + Ferritin  Check labs.  Start some prednisone  40 mg daily for 5 days for presumed lymphadenitis.  Send message in a few days if not obviously improved and we will consider a 10-day course of Augmentin .  Recommended getting a blood pressure monitor and glucose meter for her shaky episodes. The patient voiced understanding and agreement to the plan.  I spent 33 minutes with the patient discussing the above plans in addition to reviewing her chart on the same day of the visit.  Mabel Mt Roscoe, DO 04/02/24  2:58 PM

## 2024-04-02 NOTE — Patient Instructions (Signed)
 Monitor your blood pressure and sugar when this happens.   Give us  2-3 business days to get the results of your labs back.   Stay hydrated.  Send me a message Friday AM if we are not making obvious improvement.   Let us  know if you need anything.

## 2024-04-03 ENCOUNTER — Encounter: Payer: Self-pay | Admitting: Family Medicine

## 2024-04-03 ENCOUNTER — Other Ambulatory Visit: Payer: Self-pay

## 2024-04-03 ENCOUNTER — Ambulatory Visit: Payer: Self-pay | Admitting: Family Medicine

## 2024-04-03 DIAGNOSIS — E878 Other disorders of electrolyte and fluid balance, not elsewhere classified: Secondary | ICD-10-CM

## 2024-04-03 LAB — CBC
HCT: 46.1 % — ABNORMAL HIGH (ref 36.0–46.0)
Hemoglobin: 15.5 g/dL — ABNORMAL HIGH (ref 12.0–15.0)
MCHC: 33.6 g/dL (ref 30.0–36.0)
MCV: 89.5 fl (ref 78.0–100.0)
Platelets: 175 K/uL (ref 150.0–400.0)
RBC: 5.15 Mil/uL — ABNORMAL HIGH (ref 3.87–5.11)
RDW: 13.5 % (ref 11.5–15.5)
WBC: 4.3 K/uL (ref 4.0–10.5)

## 2024-04-03 LAB — COMPREHENSIVE METABOLIC PANEL WITH GFR
ALT: 21 U/L (ref 0–35)
AST: 19 U/L (ref 0–37)
Albumin: 4.9 g/dL (ref 3.5–5.2)
Alkaline Phosphatase: 67 U/L (ref 39–117)
BUN: 20 mg/dL (ref 6–23)
CO2: 28 meq/L (ref 19–32)
Calcium: 10 mg/dL (ref 8.4–10.5)
Chloride: 103 meq/L (ref 96–112)
Creatinine, Ser: 0.84 mg/dL (ref 0.40–1.20)
GFR: 76.6 mL/min (ref >60.00)
Glucose, Bld: 87 mg/dL (ref 70–99)
Potassium: 4.8 meq/L (ref 3.5–5.1)
Sodium: 140 meq/L (ref 135–145)
Total Bilirubin: 0.7 mg/dL (ref 0.2–1.2)
Total Protein: 7.3 g/dL (ref 6.0–8.3)

## 2024-04-03 LAB — IBC + FERRITIN
Ferritin: 203.2 ng/mL (ref 10.0–291.0)
Iron: 113 ug/dL (ref 42–145)
Saturation Ratios: 37 % (ref 20.0–50.0)
TIBC: 305.2 ug/dL (ref 250.0–450.0)
Transferrin: 218 mg/dL (ref 212.0–360.0)

## 2024-04-03 LAB — VITAMIN D 25 HYDROXY (VIT D DEFICIENCY, FRACTURES): VITD: 25.19 ng/mL — ABNORMAL LOW (ref 30.00–100.00)

## 2024-04-03 LAB — TSH: TSH: 1.95 u[IU]/mL (ref 0.35–5.50)

## 2024-04-03 LAB — T4, FREE: Free T4: 0.9 ng/dL (ref 0.60–1.60)

## 2024-04-03 LAB — MAGNESIUM: Magnesium: 2.3 mg/dL (ref 1.5–2.5)

## 2024-04-05 ENCOUNTER — Ambulatory Visit (INDEPENDENT_AMBULATORY_CARE_PROVIDER_SITE_OTHER)

## 2024-04-05 ENCOUNTER — Encounter: Payer: Self-pay | Admitting: Family Medicine

## 2024-04-05 DIAGNOSIS — K900 Celiac disease: Secondary | ICD-10-CM

## 2024-04-05 MED ORDER — CEFTRIAXONE SODIUM 1 G IJ SOLR
1.0000 g | Freq: Once | INTRAMUSCULAR | Status: AC
  Administered 2024-04-05: 1 g via INTRAMUSCULAR

## 2024-04-05 NOTE — Progress Notes (Signed)
 Patient here today,04/05/24 for Rocephin  1 g injection Per Dr. Kenith to have her come in for a shot of Rocephin  1 g. Thx.   She received the Rocephin  in her left ventrogluteal and tolerated it well.

## 2024-04-05 NOTE — Telephone Encounter (Signed)
 Called pt was advised and nurse visit appt schedule.

## 2024-04-19 ENCOUNTER — Other Ambulatory Visit (INDEPENDENT_AMBULATORY_CARE_PROVIDER_SITE_OTHER)

## 2024-04-19 DIAGNOSIS — E878 Other disorders of electrolyte and fluid balance, not elsewhere classified: Secondary | ICD-10-CM | POA: Diagnosis not present

## 2024-04-19 NOTE — Addendum Note (Signed)
 Addended by: TRUDY CURVIN RAMAN on: 04/19/2024 01:37 PM   Modules accepted: Orders

## 2024-04-20 ENCOUNTER — Ambulatory Visit: Payer: Self-pay | Admitting: Family Medicine

## 2024-04-22 LAB — CBC WITH DIFFERENTIAL/PLATELET
Absolute Lymphocytes: 1445 {cells}/uL (ref 850–3900)
Absolute Monocytes: 383 {cells}/uL (ref 200–950)
Basophils Absolute: 9 {cells}/uL (ref 0–200)
Basophils Relative: 0.2 %
Eosinophils Absolute: 131 {cells}/uL (ref 15–500)
Eosinophils Relative: 2.9 %
HCT: 45.4 % — ABNORMAL HIGH (ref 35.0–45.0)
Hemoglobin: 14.8 g/dL (ref 11.7–15.5)
MCH: 30 pg (ref 27.0–33.0)
MCHC: 32.6 g/dL (ref 32.0–36.0)
MCV: 91.9 fL (ref 80.0–100.0)
MPV: 12 fL (ref 7.5–12.5)
Monocytes Relative: 8.5 %
Neutro Abs: 2534 {cells}/uL (ref 1500–7800)
Neutrophils Relative %: 56.3 %
Platelets: 192 Thousand/uL (ref 140–400)
RBC: 4.94 Million/uL (ref 3.80–5.10)
RDW: 12.9 % (ref 11.0–15.0)
Total Lymphocyte: 32.1 %
WBC: 4.5 Thousand/uL (ref 3.8–10.8)

## 2024-04-22 LAB — ERYTHROPOIETIN: Erythropoietin: 7.3 m[IU]/mL (ref 2.6–18.5)

## 2024-06-11 ENCOUNTER — Ambulatory Visit: Admitting: Family Medicine

## 2024-06-11 ENCOUNTER — Encounter: Payer: Self-pay | Admitting: Family Medicine

## 2024-06-11 VITALS — BP 132/80 | HR 78 | Temp 97.5°F | Resp 16 | Ht 69.0 in | Wt 225.6 lb

## 2024-06-11 DIAGNOSIS — B9689 Other specified bacterial agents as the cause of diseases classified elsewhere: Secondary | ICD-10-CM

## 2024-06-11 DIAGNOSIS — K635 Polyp of colon: Secondary | ICD-10-CM

## 2024-06-11 DIAGNOSIS — R739 Hyperglycemia, unspecified: Secondary | ICD-10-CM | POA: Diagnosis not present

## 2024-06-11 DIAGNOSIS — L304 Erythema intertrigo: Secondary | ICD-10-CM | POA: Diagnosis not present

## 2024-06-11 DIAGNOSIS — K900 Celiac disease: Secondary | ICD-10-CM

## 2024-06-11 DIAGNOSIS — J019 Acute sinusitis, unspecified: Secondary | ICD-10-CM

## 2024-06-11 MED ORDER — AMOXICILLIN-POT CLAVULANATE 875-125 MG PO TABS: 1.0000 | ORAL_TABLET | Freq: Two times a day (BID) | ORAL | 0 refills | Status: DC

## 2024-06-11 MED ORDER — LEVALBUTEROL TARTRATE 45 MCG/ACT IN AERO: 1.0000 | INHALATION_SPRAY | Freq: Four times a day (QID) | RESPIRATORY_TRACT | 2 refills | Status: AC | PRN

## 2024-06-11 MED ORDER — METHYLPREDNISOLONE ACETATE 80 MG/ML IJ SUSP
80.0000 mg | Freq: Once | INTRAMUSCULAR | Status: AC
  Administered 2024-06-11: 80 mg via INTRAMUSCULAR

## 2024-06-11 MED ORDER — KETOCONAZOLE 2 % EX CREA: 1.0000 | TOPICAL_CREAM | Freq: Every day | CUTANEOUS | 0 refills | Status: AC

## 2024-06-11 NOTE — Patient Instructions (Addendum)
 If you do not hear anything about your referral in the next 1-2 weeks, call our office and ask for an update.  Continue to push fluids, practice good hand hygiene, and cover your mouth if you cough.  If you start having fevers, shaking or shortness of breath, seek immediate care.  OK to take Tylenol 1000 mg (2 extra strength tabs) or 975 mg (3 regular strength tabs) every 6 hours as needed.  Take the antibiotic if no improvement in the next couple days.   Give us  2-3 business days to get the results of your labs back.   Let us  know if you need anything.

## 2024-06-11 NOTE — Progress Notes (Signed)
 Chief Complaint  Patient presents with   Cough    Cough and Congestion    Sheila Lam [facility] here for URI complaints.  Duration: 1 week  Associated symptoms: sinus congestion, sinus pain, rhinorrhea, ear pain, wheezing, chest tightness, and productive cough Denies: itchy watery eyes, ear drainage, sore throat, shortness of breath, myalgia, and fevers Treatment to date: Mucinex Sick contacts: Yes- spouse with sinusitis  Patient has a skin lesion under lower abdominal region that has been present for the past week or so.  No new lotions, soaps, topicals or detergents.  It is painful and somewhat itchy.  There is no drainage from the area.  She has been using topical steroids and Neosporin on it without significant relief.  No close contacts with similar symptoms.  No trauma to the area.  Patient has a history of celiac disease in addition to colon polyps.  She is supposed to get colonoscopies every 2 years.  She is requesting a female gastroenterologist and is requesting a referral.  Denies any bleeding.  She is having loose stools.  She also had a CGM for 30 days.  She is having glucose spikes around 4 PM.  This was correlating to her shaking.  Her last meal prior to this is around 12 PM.  Does not seem to correlate with anything she is eating.  No history of diabetes.  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   History of stomach ulcers    Joint pain    Lactose intolerance    Osteoarthritis    Other fatigue    Vitamin D  deficiency     Objective BP 132/80 (BP Location: Left Arm, Patient Position: Sitting)   Pulse 78   Temp (!) 97.5 F (36.4 C) (Oral)   Resp 16   Ht 5' 9 (1.753 m)   Wt 225 lb 9.6 oz  (102.3 kg)   SpO2 97%   BMI 33.32 kg/m  General: Awake, alert, appears stated age HEENT: AT, [place], ears patent b/l and TM's neg, nares patent w clear discharge, pharynx pink and without exudates, MMM, + sinus TTP over the left maxillary sinus Neck: No masses or asymmetry Heart: RRR Lungs: CTAB, no accessory muscle use Skin: Pinkish/red patch over the lower right abdominal region without scaling, fluctuance, drainage, excessive warmth, streaking redness Abdomen: Bowel sounds present, soft, nontender, nondistended Psych: Age appropriate judgment and insight, normal mood and affect  Celiac disease - Plan: Ambulatory referral to Gastroenterology  Polyp of colon, unspecified part of colon, unspecified type - Plan: Ambulatory referral to Gastroenterology  Hyperglycemia - Plan: Insulin and C-Peptide, Hemoglobin A1c  Intertrigo - Plan: ketoconazole (NIZORAL) 2 % cream  Acute bacterial sinusitis - Plan: amoxicillin -clavulanate (AUGMENTIN ) 875-125 MG tablet, methylPREDNISolone  acetate (DEPO-MEDROL ) injection 80 mg  1/2.  Refer to GI at her request.   3.  Check insulin, C-peptide, and A1c.  May need to consider low-dose metformin to narrow the range of her sugars.   4.  2 weeks of ketoconazole cream.  No more steroids.   5.  Depo-Medrol  injection today.  If no improvement  the next couple days, she will take a 7-day course of Augmentin .  Continue to push fluids, practice good hand hygiene, cover mouth when coughing. F/u prn. If starting to experience fevers, shaking, or shortness of breath, seek immediate care. Pt voiced understanding and agreement to the plan.  I spent 31 minutes with the patient discussing the above plans in addition to reviewing her chart on the same day of the visit.  Sheila Mt Cartwright, DO 06/11/24 4:30 PM

## 2024-06-12 LAB — HEMOGLOBIN A1C: Hgb A1c MFr Bld: 5.5 % (ref 4.6–6.5)

## 2024-06-13 ENCOUNTER — Ambulatory Visit: Payer: Self-pay | Admitting: Family Medicine

## 2024-06-13 LAB — INSULIN AND C-PEPTIDE, SERUM
C-Peptide: 4.4 ng/mL (ref 1.1–4.4)
INSULIN: 17.8 u[IU]/mL (ref 2.6–24.9)

## 2024-06-15 ENCOUNTER — Encounter: Payer: Self-pay | Admitting: Family Medicine

## 2024-06-18 ENCOUNTER — Other Ambulatory Visit: Payer: Self-pay | Admitting: Family Medicine

## 2024-06-18 DIAGNOSIS — E6609 Other obesity due to excess calories: Secondary | ICD-10-CM

## 2024-06-18 MED ORDER — TIRZEPATIDE-WEIGHT MANAGEMENT 2.5 MG/0.5ML ~~LOC~~ SOLN: 2.5000 mg | SUBCUTANEOUS | 0 refills | Status: DC

## 2024-06-19 NOTE — Telephone Encounter (Signed)
 Called pt 3 month f/u scheduled.

## 2024-07-01 ENCOUNTER — Encounter: Payer: Self-pay | Admitting: Family Medicine

## 2024-07-02 ENCOUNTER — Telehealth: Payer: Self-pay | Admitting: Pharmacist

## 2024-07-02 NOTE — Telephone Encounter (Signed)
 Spoke with patient. Provided education about how to inject Zepbound using syringes provided by [employer]. She received insulin syringes so it is marked in units and not CC's on mL. So the 50 units mark = 0.59mL or 0.5 cc's Also discussed cleaning top before entering the vial. Leaving the vial to warm to room temperature for about 45 minutes prior to injecting.   Patient reports even the small dose she took (suspect was about 0.25mg ) seems to have helped with her celiac disease and she feels less bloated.

## 2024-07-24 ENCOUNTER — Other Ambulatory Visit: Payer: Self-pay

## 2024-07-24 MED ORDER — TIRZEPATIDE-WEIGHT MANAGEMENT 2.5 MG/0.5ML ~~LOC~~ SOLN: 2.5000 mg | SUBCUTANEOUS | 0 refills | Status: DC

## 2024-08-14 ENCOUNTER — Telehealth: Admitting: Family Medicine

## 2024-08-14 DIAGNOSIS — B9689 Other specified bacterial agents as the cause of diseases classified elsewhere: Secondary | ICD-10-CM

## 2024-08-14 MED ORDER — AMOXICILLIN-POT CLAVULANATE 875-125 MG PO TABS: 1.0000 | ORAL_TABLET | Freq: Two times a day (BID) | ORAL | 0 refills | Status: AC

## 2024-08-14 NOTE — Progress Notes (Signed)
 E-Visit for Sinus Problems  We are sorry that you are not feeling well.  Here is how we plan to help!  Based on what you have shared with me it looks like you have sinusitis.  Sinusitis is inflammation and infection in the sinus cavities of the head.  Based on your presentation I believe you most likely have Acute Bacterial Sinusitis.  This is an infection caused by bacteria and is treated with antibiotics. I have prescribed Augmentin  875mg /125mg  one tablet twice daily with food, for 7 days. This is ok to take with your current prescription medications. You may use an oral decongestant such as Mucinex D or if you have glaucoma or high blood pressure use plain Mucinex. Saline nasal spray help and can safely be used as often as needed for congestion.  If you develop worsening sinus pain, fever or notice severe headache and vision changes, or if symptoms are not better after completion of antibiotic, please schedule an appointment with a health care provider.    Sinus infections are not as easily transmitted as other respiratory infection, however we still recommend that you avoid close contact with loved ones, especially the very young and elderly.  Remember to wash your hands thoroughly throughout the day as this is the number one way to prevent the spread of infection!  Home Care: Only take medications as instructed by your medical team. Complete the entire course of an antibiotic. Do not take these medications with alcohol. A steam or ultrasonic humidifier can help congestion.  You can place a towel over your head and breathe in the steam from hot water coming from a faucet. Avoid close contacts especially the very young and the elderly. Cover your mouth when you cough or sneeze. Always remember to wash your hands.  Get Help Right Away If: You develop worsening fever or sinus pain. You develop a severe head ache or visual changes. Your symptoms persist after you have completed your treatment  plan.  Make sure you Understand these instructions. Will watch your condition. Will get help right away if you are not doing well or get worse.  Your e-visit answers were reviewed by a board certified advanced clinical practitioner to complete your personal care plan.  Depending on the condition, your plan could have included both over the counter or prescription medications.  If there is a problem please reply  once you have received a response from your provider.  Your safety is important to us .  If you have drug allergies check your prescription carefully.    You can use MyChart to ask questions about today's visit, request a non-urgent call back, or ask for a work or school excuse for 24 hours related to this e-Visit. If it has been greater than 24 hours you will need to follow up with your provider, or enter a new e-Visit to address those concerns.  You will get an e-mail in the next two days asking about your experience.  I hope that your e-visit has been valuable and will speed your recovery. Thank you for using e-visits.  I have spent 5 minutes in review of e-visit questionnaire, review and updating patient chart, medical decision making and response to patient.   Elsie Velma Lunger, PA-C

## 2024-08-17 ENCOUNTER — Encounter: Payer: Self-pay | Admitting: Family Medicine

## 2024-09-13 ENCOUNTER — Encounter: Payer: Self-pay | Admitting: Family Medicine

## 2024-09-13 ENCOUNTER — Ambulatory Visit: Admitting: Family Medicine

## 2024-09-14 ENCOUNTER — Encounter: Payer: Self-pay | Admitting: Family Medicine

## 2024-09-16 ENCOUNTER — Other Ambulatory Visit: Payer: Self-pay

## 2024-09-16 ENCOUNTER — Telehealth: Payer: Self-pay | Admitting: Pharmacist

## 2024-09-16 MED ORDER — TIRZEPATIDE-WEIGHT MANAGEMENT 2.5 MG/0.5ML ~~LOC~~ SOLN: 2.5000 mg | SUBCUTANEOUS | 1 refills | Status: AC

## 2024-09-16 MED ORDER — TIRZEPATIDE-WEIGHT MANAGEMENT 2.5 MG/0.5ML ~~LOC~~ SOLN: 2.5000 mg | SUBCUTANEOUS | 0 refills | Status: DC

## 2024-09-16 NOTE — Telephone Encounter (Signed)
 Called [facility] to cancel Rx that was sent in earlier today. It needed to go [facility] instead.  Rx sent to [facility].

## 2024-09-18 ENCOUNTER — Ambulatory Visit: Admitting: Family Medicine

## 2024-09-30 ENCOUNTER — Encounter: Payer: Self-pay | Admitting: Family Medicine

## 2024-10-16 ENCOUNTER — Encounter: Payer: Self-pay | Admitting: Family Medicine

## 2024-10-29 ENCOUNTER — Ambulatory Visit: Admitting: Family Medicine
